# Patient Record
Sex: Female | Born: 1935 | Race: White | Hispanic: No | Marital: Married | Smoking: Never smoker
Health system: Southern US, Community
[De-identification: ages and names within clinical notes are randomized; demographics above are authoritative.]

## PROBLEM LIST (undated history)

## (undated) DIAGNOSIS — I10 Essential (primary) hypertension: Secondary | ICD-10-CM

## (undated) DIAGNOSIS — E079 Disorder of thyroid, unspecified: Secondary | ICD-10-CM

## (undated) DIAGNOSIS — I4891 Unspecified atrial fibrillation: Secondary | ICD-10-CM

## (undated) DIAGNOSIS — M1990 Unspecified osteoarthritis, unspecified site: Secondary | ICD-10-CM

## (undated) HISTORY — PX: JOINT REPLACEMENT: SHX530

---

## 1999-11-04 ENCOUNTER — Encounter: Admission: RE | Admit: 1999-11-04 | Discharge: 1999-11-04 | Payer: Self-pay | Admitting: Internal Medicine

## 1999-11-04 ENCOUNTER — Encounter: Payer: Self-pay | Admitting: Internal Medicine

## 2001-05-01 ENCOUNTER — Emergency Department (HOSPITAL_COMMUNITY): Admission: EM | Admit: 2001-05-01 | Discharge: 2001-05-01 | Payer: Self-pay

## 2002-06-06 ENCOUNTER — Encounter: Payer: Self-pay | Admitting: Internal Medicine

## 2002-06-06 ENCOUNTER — Encounter: Admission: RE | Admit: 2002-06-06 | Discharge: 2002-06-06 | Payer: Self-pay | Admitting: Internal Medicine

## 2002-06-07 ENCOUNTER — Encounter: Payer: Self-pay | Admitting: Internal Medicine

## 2002-06-07 ENCOUNTER — Encounter: Admission: RE | Admit: 2002-06-07 | Discharge: 2002-06-07 | Payer: Self-pay | Admitting: Internal Medicine

## 2006-10-18 ENCOUNTER — Encounter: Admission: RE | Admit: 2006-10-18 | Discharge: 2006-10-18 | Payer: Self-pay | Admitting: Sports Medicine

## 2006-11-30 ENCOUNTER — Ambulatory Visit (HOSPITAL_COMMUNITY): Admission: RE | Admit: 2006-11-30 | Discharge: 2006-11-30 | Payer: Self-pay | Admitting: Orthopedic Surgery

## 2006-12-15 ENCOUNTER — Encounter: Admission: RE | Admit: 2006-12-15 | Discharge: 2006-12-15 | Payer: Self-pay | Admitting: Orthopedic Surgery

## 2007-11-04 ENCOUNTER — Encounter: Admission: RE | Admit: 2007-11-04 | Discharge: 2007-11-04 | Payer: Self-pay | Admitting: Orthopedic Surgery

## 2008-02-01 ENCOUNTER — Inpatient Hospital Stay (HOSPITAL_COMMUNITY): Admission: RE | Admit: 2008-02-01 | Discharge: 2008-02-06 | Payer: Self-pay | Admitting: Orthopedic Surgery

## 2010-10-03 ENCOUNTER — Inpatient Hospital Stay (INDEPENDENT_AMBULATORY_CARE_PROVIDER_SITE_OTHER)
Admission: RE | Admit: 2010-10-03 | Discharge: 2010-10-03 | Disposition: A | Discharge status: Home / Self Care | Payer: PRIVATE HEALTH INSURANCE | Source: Ambulatory Visit | Attending: Family Medicine | Admitting: Family Medicine

## 2010-10-03 ENCOUNTER — Ambulatory Visit (INDEPENDENT_AMBULATORY_CARE_PROVIDER_SITE_OTHER): Payer: PRIVATE HEALTH INSURANCE

## 2010-10-03 DIAGNOSIS — J40 Bronchitis, not specified as acute or chronic: Secondary | ICD-10-CM

## 2010-12-30 NOTE — Discharge Summary (Signed)
NAMEGRENDA, [name]   MEDICAL RECORD NO.:  [id number]          PATIENT TYPE:  INP   LOCATION:  5005                         FACILITY:  [facility]   PHYSICIAN:  Loreta Ave, M.D. DATE OF BIRTH:  October 28, 1920   DATE OF ADMISSION:  02/01/2008  DATE OF DISCHARGE:                               DISCHARGE SUMMARY   FINAL DIAGNOSES:  1. Status post right total hip replacement for end-stage degenerative      joint disease.  2. Atrial fibrillation.  3. Hypertension.   HISTORY OF PRESENT ILLNESS:  An 77 year old white female with history of  end-stage DJD right hip presented to our office with complaints of  chronic pain.  Symptoms progressively worsened with failed conservative  treatment.   HOSPITAL COURSE:  On February 01, 2008, the patient was taken to the [facility] OR and a right total hip replacement procedure performed.  Surgeon  Loreta Ave, M.D. and assistant Genene Churn. Barry Dienes, PA-C.   ANESTHESIA:  General.   ESTIMATED BLOOD LOSS:  250 mL.   There were no surgical or anesthesia complications and the patient was  transferred to recovery in stable condition.  February 02, 2008, the patient  doing well and pain controlled.  Dressing clean, dry, and intact.  Calf  nontender.  Neurovascularly intact.   PHARMACY PROTOCOL:  Coumadin and heparin protocol started as well for  DVT prophylaxis.   CARE MANAGEMENT:  To start arranging transfer to [place] skilled  facility.  DC'd PCA.   On February 03, 2008, the patient states that she is doing great.  She says  that she is ready for transfer to [facility] when a bed is available.  Pain control.  She has done some hall ambulation.   Temperature 97, pulse 80, respirations 18, blood pressure 105/59.  WBC  12.1, hemoglobin 10.0, hematocrit 29.6, platelets 174.  Sodium 134,  potassium 3.3, chloride 94, CO2 31, BUN 14, creatinine 0.82, glucose  124, INR 1.1.   The wound looks good, staples intact.  No  drainage or signs of  infection.  Calf nontender.  She is neurovascularly intact distally.   CONDITION:  Good and stable.   DISPOSITION:  Transfer to skilled nursing facility.   DISCHARGE MEDICATIONS:  1. Lanoxin 125 mcg 1 tablet on odd days and 2 tablets on even days.  2. Triamterine 75/50 one tab p.o. q. a.m.  3. Coumadin pharmacy protocol x4 weeks for postoperative DVT      prophylaxis.  Maintain INR 2 to 3.  4. Lovenox 40 mg 1 subcutaneous injection daily and discontinue when      Coumadin is therapeutic with INR of 2 to 3.  5. Percocet 5/325 one to two tabs p.o. q.4 to 6 hours p.r.n. pain.  6. Robaxin 500 mg 1 tab p.o. q.6 hours p.r.n. spasm.   INSTRUCTIONS:  While at the skilled facility, the patient will continue  to work with PT/OT to improve hip range of motion, strengthening, and  ambulation.  She is weightbearing as tolerated with a walker.  Daily  dressing changes with 4 x  4 gauze and tape.  She is okay to shower, but  no tub soaking.  PT to instruct hip precautions.  She will follow up in  the office 2 weeks postop for recheck with Dr. Eulah Pont.  Notify us  immediately if there are any questions or concerns before that time.  Hip staples will be removed at 2 weeks postop in our office.      Genene Churn. Denton Meek.      Loreta Ave, M.D.  Electronically Signed    JMO/MEDQ  D:  02/03/2008  T:  02/03/2008  Job:  [id number]

## 2010-12-30 NOTE — Discharge Summary (Signed)
NAME:  [name], BERNATH NO.:  [id number]   MEDICAL RECORD NO.:  [id number]          PATIENT TYPE:  INP   LOCATION:  5005                         FACILITY:  [facility]   PHYSICIAN:  Loreta Ave, M.D. DATE OF BIRTH:  1921-05-06   DATE OF ADMISSION:  02/01/2008  DATE OF DISCHARGE:                               DISCHARGE SUMMARY   ADDENDUM   HOSPITAL COURSE:  On February 04, 2008, the patient doing well without  complaints.  Vital signs stable, afebrile.  INR 1.1 and hemoglobin 10.  Wound looks good and staples intact.  No drainage or signs of infection.  Awaiting Skilled Nursing Facility placement.  Transfer paper not all  completed from care management.  On February 05, 2008, the patient doing  well.  Again no complaints.  Vital signs stable, afebrile.  Wound looks  good.  Hemoglobin 99.  INR 1.3.  On February 06, 2008, the patient states  that her right hip is feeling great.  Pain controlled.  She has not had  a bowel movement yet.  Positive flatus.  Denies chest pain, shortness of  breath, abdominal pain, nausea, or vomiting.  States that she is ready  for transfer to [facility] after she has a bowel movement today.  Wound  looks good and staples intact.  No drainage or signs of infection.  Temperature 97.2, pulse 80, respirations 18, and blood pressure 120/62.   INSTRUCTIONS:  The patient will need to follow up in the office with Dr.  Eulah Pont when she is 2 weeks postop.  Daily dressing changes with 4 x 4  gauze and tape.  Please notify our office immediately if there are any  questions or concerns.      Loreta Ave, M.D.  Electronically Signed     DFM/MEDQ  D:  02/06/2008  T:  02/06/2008  Job:  [id number]

## 2010-12-30 NOTE — Op Note (Signed)
[name], [name] NO.:  [id number]   MEDICAL RECORD NO.:  [id number]          PATIENT TYPE:  INP   LOCATION:  5005                         FACILITY:  [facility]   PHYSICIAN:  Loreta Ave, M.D. DATE OF BIRTH:  [birth year]   DATE OF PROCEDURE:  02/01/2008  DATE OF DISCHARGE:                               OPERATIVE REPORT   PREOPERATIVE DIAGNOSIS:  End-stage degenerative arthritis right hip with  marked bony erosion throughout.  Underlying osteoporosis.   POSTOPERATIVE DIAGNOSIS:  End-stage degenerative arthritis right hip  with marked bony erosion throughout.  Underlying osteoporosis.   PROCEDURE:  Right total hip replacement utilizing Stryker prosthesis.  PressFit 54-mm acetabular shell screw fixation x2.  40-mm internal  diameter polyethylene liner.  Pressurized cement technique for a  cemented EON plus, #6 femoral component restoring normal anteversion.  A  40-mm plus 2.5-mm metallic head.   SURGEON:  Loreta Ave, M.D.   ASSISTANT:  Zonia Kief, PA, present throughout the entire case  necessary for timely completion of procedure.   ANESTHESIA:  General   BLOOD LOSS:  300 mL.   BLOOD GIVEN:  None.   SPECIMENS:  None.   COUNTS:  None.   COMPLICATIONS:  None.   DRESSING:  Soft compressive with abduction pillow.   PROCEDURE:  The patient was brought to the operating room, placed on the  operating table in supine position.  After adequate anesthesia had been  obtained, turned to a lateral position right side up.  Prepped and  draped in usual sterile fashion.  An incision along the shaft of the  femur to the trochanter and then extending posterosuperior.  Skin and  subcutaneous tissue divided.  Iliotibial band exposed and incised.  Marked atrophic tissue from numerous injections in a trochanter.  The  bursa resected.  External rotator capsule taken down off the back of the  intertrochanteric groove on the femur.  These were also very thinned  and  a little atrophic from injections.  They were tagged with FiberWire.  The hip dislocated.  Very soft bone with extensive osteolysis on both  sides of the joint.  Femoral neck cut one fingerbreadth above the lesser  trochanter.  Acetabulum exposed.  This was cleared out and then  sequentially reamed up to good bleeding bone with good medial and  inferior placement.  Fitted with a 54-mm component which was placed in  45 degrees of abduction, 20 degrees of anteversion.  Good capturing and  fixation, which was augmented with 2 screws through the cup.  A 40-mm  internal diameter polyethylene liner applied and hammered in place.  Attention turned to the femur.  Power and then handheld reamers and  broaches up to good filling.  Size for #6 cemented component.  After  appropriate trials, I chose the 40 mm +2.5 mm head.  This gave equal  lengths, good stability in flexion and extension.  All trials removed.  Copious irrigation.  Cement restricter placed distal to the femoral  component.  Pulse lavage of the shaft.  Cement prepared and then under  pressurized cement technique, the  femoral component was cemented down  into place into the femur restoring normal anteversion.  Once the cement  hardened, the head was attached with a 40-mm head +2.5 mm.  Hip reduced.  Again I was very pleased with alignment position, leg lengths, and  stability.  Wound irrigated.  External rotator capsule repaired at the  back of the intertrochanteric grooves with drill holes out of a bony  bridge.  Charnley retractor removed.  Iliotibial band closed with #1  Vicryls.  Skin and subcutaneous tissue with Vicryl and staples.  Margins  were injected Marcaine.  Sterile compressive dressing applied.  Returned  in supine position.  Anesthesia reversed.  Brought to recovery room.  He  tolerated surgery well.  No complications.      Loreta Ave, M.D.  Electronically Signed     DFM/MEDQ  D:  02/01/2008  T:   02/02/2008  Job:  [id number]

## 2011-05-14 LAB — URINALYSIS, ROUTINE W REFLEX MICROSCOPIC
Bilirubin Urine: NEGATIVE
Hgb urine dipstick: NEGATIVE
Ketones, ur: NEGATIVE
Nitrite: NEGATIVE
Nitrite: NEGATIVE
Protein, ur: NEGATIVE
Specific Gravity, Urine: 1.016
Urobilinogen, UA: 0.2
Urobilinogen, UA: 0.2
pH: 7.5

## 2011-05-14 LAB — BASIC METABOLIC PANEL
BUN: 14
CO2: 29
CO2: 31
CO2: 32
Chloride: 100
Chloride: 101
Chloride: 97
Chloride: 98
Creatinine, Ser: 0.82
Creatinine, Ser: 0.83
Creatinine, Ser: 0.84
GFR calc Af Amer: >60
GFR calc Af Amer: >60
GFR calc Af Amer: >60
GFR calc non Af Amer: >60
Glucose, Bld: 106 — ABNORMAL HIGH
Glucose, Bld: 117 — ABNORMAL HIGH
Glucose, Bld: 124 — ABNORMAL HIGH
Potassium: 3.3 — ABNORMAL LOW
Potassium: 3.8
Potassium: 4.4
Sodium: 134 — ABNORMAL LOW

## 2011-05-14 LAB — CBC
HCT: 44.5
HCT: 29.3 — ABNORMAL LOW
HCT: 29.4 — ABNORMAL LOW
HCT: 29.6 — ABNORMAL LOW
HCT: 30.4 — ABNORMAL LOW
Hemoglobin: 10 — ABNORMAL LOW
Hemoglobin: 10.7 — ABNORMAL LOW
Hemoglobin: 9.9 — ABNORMAL LOW
MCHC: 34
MCHC: 33.8
MCHC: 34.1
MCHC: 34.1
MCV: 93.5
MCV: 93.6
MCV: 93.7
MCV: 93.8
MCV: 94.3
Platelets: 217
RBC: 4.76
RBC: 3.08 — ABNORMAL LOW
RBC: 3.13 — ABNORMAL LOW
RBC: 3.24 — ABNORMAL LOW
RDW: 12.7
RDW: 12.9
RDW: 12.9
WBC: 14.3 — ABNORMAL HIGH
WBC: 13.8 — ABNORMAL HIGH

## 2011-05-14 LAB — COMPREHENSIVE METABOLIC PANEL
BUN: 20
CO2: 24
Calcium: 9.6
Chloride: 100
Creatinine, Ser: 1.03
GFR calc non Af Amer: 51 — ABNORMAL LOW
Total Bilirubin: 0.9

## 2011-05-14 LAB — URINE MICROSCOPIC-ADD ON

## 2011-05-14 LAB — TYPE AND SCREEN
ABO/RH(D): O POS
Antibody Screen: POSITIVE
Antibody Screen: POSITIVE
Donor AG Type: NEGATIVE
PT AG Type: NEGATIVE

## 2011-05-14 LAB — APTT: aPTT: 33

## 2011-05-14 LAB — PROTIME-INR
INR: 1
Prothrombin Time: 13.4
Prothrombin Time: 14
Prothrombin Time: 16.9 — ABNORMAL HIGH

## 2011-05-14 LAB — URINE CULTURE

## 2011-10-26 ENCOUNTER — Emergency Department (INDEPENDENT_AMBULATORY_CARE_PROVIDER_SITE_OTHER)
Admission: EM | Admit: 2011-10-26 | Discharge: 2011-10-26 | Disposition: A | Discharge status: Home / Self Care | Payer: Medicare Other | Source: Home / Self Care | Attending: Family Medicine | Admitting: Family Medicine

## 2011-10-26 ENCOUNTER — Encounter (HOSPITAL_COMMUNITY): Payer: Self-pay | Admitting: *Deleted

## 2011-10-26 DIAGNOSIS — K029 Dental caries, unspecified: Secondary | ICD-10-CM

## 2011-10-26 DIAGNOSIS — S025XXA Fracture of tooth (traumatic), initial encounter for closed fracture: Secondary | ICD-10-CM

## 2011-10-26 DIAGNOSIS — S032XXA Dislocation of tooth, initial encounter: Secondary | ICD-10-CM

## 2011-10-26 HISTORY — DX: Disorder of thyroid, unspecified: E07.9

## 2011-10-26 HISTORY — DX: Essential (primary) hypertension: I10

## 2011-10-26 HISTORY — DX: Unspecified atrial fibrillation: I48.91

## 2011-10-26 MED ORDER — OXYCODONE-ACETAMINOPHEN 5-325 MG PO TABS: ORAL_TABLET | ORAL | Status: AC

## 2011-10-26 MED ORDER — CLINDAMYCIN HCL 150 MG PO CAPS: 150.0000 mg | ORAL_CAPSULE | Freq: Four times a day (QID) | ORAL | Status: AC

## 2011-10-26 NOTE — ED Provider Notes (Signed)
History     CSN: [id number]  Arrival date & time 10/26/11  1643   First MD Initiated Contact with Patient 10/26/11 1715      Chief Complaint  Patient presents with  . Dental Pain    (Consider location/radiation/quality/duration/timing/severity/associated sxs/prior treatment) HPI Comments: [name] presents for evaluation of right upper tooth pain. She reports that she a crown in place. That broke off. Wednesday evening during dinner. She denies any discomfort in the tooth until Friday. She spoke with her dentist, who will see her next week in the office. She reports that the pain is unbearable has not responded to Orajel or tramadol.  Patient is a 78 y.o. female presenting with tooth pain. The history is provided by the patient.  Dental PainThe primary symptoms include mouth pain and dental injury. The symptoms began 5 to 7 days ago. The symptoms are worsening. The symptoms are new.  Mouth pain began 5 - 7 days ago. Mouth pain occurs constantly. Mouth pain is worsening. Affected locations include: teeth and gum(s). At its highest the mouth pain was at 10/10.  The dental injury occurred 5 - 7 days ago. Affected teeth include: 7/right upper lateral incisor. The injury is a crown fracture. The injury was caused by an unknown mechanism.  Additional symptoms include: dental sensitivity to temperature and gum tenderness. Additional symptoms do not include: purulent gums.    Past Medical History  Diagnosis Date  . Atrial fibrillation   . Hypertension   . Thyroid disease     Hypothyroidism    Past Surgical History  Procedure Date  . Joint replacement     Hip replacement    No family history on file.  History  Substance Use Topics  . Smoking status: Not on file  . Smokeless tobacco: Not on file  . Alcohol Use: No    OB History    Grav Para Term Preterm Abortions TAB SAB Ect Mult Living                  Review of Systems  Constitutional: Negative.   HENT: Positive for  dental problem.   Eyes: Negative.   Respiratory: Negative.   Cardiovascular: Negative.   Gastrointestinal: Negative.   Genitourinary: Negative.   Musculoskeletal: Negative.   Skin: Negative.   Neurological: Negative.     Allergies  Penicillins  Home Medications   Current Outpatient Rx  Name Route Sig Dispense Refill  . ASPIRIN 81 MG PO TABS Oral Take 81 mg by mouth daily.    Marland Kitchen DIGOXIN PO Oral Take by mouth.    Marland Kitchen LEVOTHYROXINE SODIUM PO Oral Take by mouth.    . TRAMADOL HCL PO Oral Take by mouth.    . TRIAMTERENE-HCTZ 50-25 MG PO CAPS Oral Take 1 capsule by mouth every morning.    Marland Kitchen CLINDAMYCIN HCL 150 MG PO CAPS Oral Take 1 capsule (150 mg total) by mouth every 6 (six) hours. 28 capsule 0  . OXYCODONE-ACETAMINOPHEN 5-325 MG PO TABS  Take one to two tablets every 4 to 6 hours as needed for pain 20 tablet 0    BP 171/83  Pulse 100  Temp(Src) 98.6 F (37 C) (Oral)  Resp 16  SpO2 96%  Physical Exam  Nursing note and vitals reviewed. Constitutional: She is oriented to person, place, and time. She appears well-developed and well-nourished.  HENT:  Head: Normocephalic and atraumatic.  Right Ear: Tympanic membrane normal.  Left Ear: Tympanic membrane normal.  Mouth/Throat:    Eyes:  EOM are normal.  Neck: Normal range of motion.  Pulmonary/Chest: Effort normal.  Musculoskeletal: Normal range of motion.  Neurological: She is alert and oriented to person, place, and time.  Skin: Skin is warm and dry.  Psychiatric: Her behavior is normal.    ED Course  Procedures (including critical care time)  Labs Reviewed - No data to display No results found.   1. Tooth avulsion   2. Dental caries       MDM  rx given for clindamycin 150 mg 4x daily x 7 days, with PRN oxycodone/acetaminophen; will follow up with dental provider next week as scheduled        Renaee Munda, MD 10/26/11 2055

## 2011-10-26 NOTE — ED Notes (Signed)
Reports right upper tooth breaking off Wed; Friday started w/ some discomfort; spoke with dentist today - dentist informed they would have to contact PCP office to [place] for treatment based on hx;  At 1200 today pain became unbearable.  Has taken her normal tramadol without relief; has also tried Orajel.

## 2011-10-26 NOTE — Discharge Instructions (Signed)
Take the antibiotics and pain medication as directed. You may also continue to use topical anesthetics such as Orajel or similar preparation for pain relief. Please follow up with your dental provider next week as scheduled and as discussed. Return to care should your symptoms not improve, or worsen in any way.

## 2012-08-29 ENCOUNTER — Other Ambulatory Visit: Payer: Self-pay | Admitting: Internal Medicine

## 2012-08-29 ENCOUNTER — Ambulatory Visit
Admission: RE | Admit: 2012-08-29 | Discharge: 2012-08-29 | Disposition: A | Discharge status: Home / Self Care | Payer: Self-pay | Source: Ambulatory Visit | Attending: Internal Medicine | Admitting: Internal Medicine

## 2012-08-29 DIAGNOSIS — R52 Pain, unspecified: Secondary | ICD-10-CM

## 2013-12-27 ENCOUNTER — Ambulatory Visit
Admission: RE | Admit: 2013-12-27 | Discharge: 2013-12-27 | Disposition: A | Discharge status: Home / Self Care | Payer: Commercial Managed Care - HMO | Source: Ambulatory Visit | Attending: Internal Medicine | Admitting: Internal Medicine

## 2013-12-27 ENCOUNTER — Other Ambulatory Visit: Payer: Self-pay | Admitting: Internal Medicine

## 2013-12-27 DIAGNOSIS — M545 Low back pain, unspecified: Secondary | ICD-10-CM

## 2014-03-14 ENCOUNTER — Ambulatory Visit
Admission: RE | Admit: 2014-03-14 | Discharge: 2014-03-14 | Disposition: A | Discharge status: Home / Self Care | Payer: Commercial Managed Care - HMO | Source: Ambulatory Visit | Attending: Internal Medicine | Admitting: Internal Medicine

## 2014-03-14 ENCOUNTER — Other Ambulatory Visit: Payer: Self-pay | Admitting: Internal Medicine

## 2014-03-14 DIAGNOSIS — M25562 Pain in left knee: Secondary | ICD-10-CM

## 2014-09-23 ENCOUNTER — Encounter (HOSPITAL_COMMUNITY): Payer: Self-pay

## 2014-09-23 ENCOUNTER — Inpatient Hospital Stay (HOSPITAL_COMMUNITY): Payer: Medicare HMO

## 2014-09-23 ENCOUNTER — Emergency Department (HOSPITAL_COMMUNITY): Payer: Medicare HMO

## 2014-09-23 ENCOUNTER — Inpatient Hospital Stay (HOSPITAL_COMMUNITY)
Admission: EM | Admit: 2014-09-23 | Discharge: 2014-09-26 | DRG: 065 | Disposition: A | Discharge status: Skilled Nursing Facility | Payer: Medicare HMO | Attending: Internal Medicine | Admitting: Internal Medicine

## 2014-09-23 DIAGNOSIS — W19XXXA Unspecified fall, initial encounter: Secondary | ICD-10-CM

## 2014-09-23 DIAGNOSIS — Z79899 Other long term (current) drug therapy: Secondary | ICD-10-CM | POA: Diagnosis not present

## 2014-09-23 DIAGNOSIS — E876 Hypokalemia: Secondary | ICD-10-CM | POA: Diagnosis present

## 2014-09-23 DIAGNOSIS — M1990 Unspecified osteoarthritis, unspecified site: Secondary | ICD-10-CM | POA: Diagnosis present

## 2014-09-23 DIAGNOSIS — Z96649 Presence of unspecified artificial hip joint: Secondary | ICD-10-CM | POA: Diagnosis present

## 2014-09-23 DIAGNOSIS — Z880 Allergy status to penicillin: Secondary | ICD-10-CM | POA: Diagnosis not present

## 2014-09-23 DIAGNOSIS — I272 Other secondary pulmonary hypertension: Secondary | ICD-10-CM | POA: Diagnosis present

## 2014-09-23 DIAGNOSIS — G8194 Hemiplegia, unspecified affecting left nondominant side: Secondary | ICD-10-CM | POA: Diagnosis present

## 2014-09-23 DIAGNOSIS — I63411 Cerebral infarction due to embolism of right middle cerebral artery: Secondary | ICD-10-CM | POA: Diagnosis present

## 2014-09-23 DIAGNOSIS — Z66 Do not resuscitate: Secondary | ICD-10-CM | POA: Diagnosis present

## 2014-09-23 DIAGNOSIS — Z7982 Long term (current) use of aspirin: Secondary | ICD-10-CM | POA: Diagnosis not present

## 2014-09-23 DIAGNOSIS — R531 Weakness: Secondary | ICD-10-CM | POA: Diagnosis not present

## 2014-09-23 DIAGNOSIS — Z79891 Long term (current) use of opiate analgesic: Secondary | ICD-10-CM

## 2014-09-23 DIAGNOSIS — I10 Essential (primary) hypertension: Secondary | ICD-10-CM | POA: Diagnosis present

## 2014-09-23 DIAGNOSIS — I639 Cerebral infarction, unspecified: Secondary | ICD-10-CM | POA: Diagnosis present

## 2014-09-23 DIAGNOSIS — R471 Dysarthria and anarthria: Secondary | ICD-10-CM | POA: Diagnosis present

## 2014-09-23 DIAGNOSIS — I071 Rheumatic tricuspid insufficiency: Secondary | ICD-10-CM | POA: Diagnosis present

## 2014-09-23 DIAGNOSIS — I4891 Unspecified atrial fibrillation: Secondary | ICD-10-CM | POA: Diagnosis present

## 2014-09-23 DIAGNOSIS — R1314 Dysphagia, pharyngoesophageal phase: Secondary | ICD-10-CM | POA: Diagnosis present

## 2014-09-23 DIAGNOSIS — N179 Acute kidney failure, unspecified: Secondary | ICD-10-CM | POA: Diagnosis present

## 2014-09-23 DIAGNOSIS — E039 Hypothyroidism, unspecified: Secondary | ICD-10-CM | POA: Diagnosis present

## 2014-09-23 DIAGNOSIS — E785 Hyperlipidemia, unspecified: Secondary | ICD-10-CM | POA: Diagnosis present

## 2014-09-23 DIAGNOSIS — I481 Persistent atrial fibrillation: Secondary | ICD-10-CM | POA: Diagnosis present

## 2014-09-23 HISTORY — DX: Unspecified osteoarthritis, unspecified site: M19.90

## 2014-09-23 LAB — CBC
HEMATOCRIT: 38.8 % (ref 36.0–46.0)
Hemoglobin: 12.5 g/dL (ref 12.0–15.0)
MCH: 30 pg (ref 26.0–34.0)
MCHC: 32.2 g/dL (ref 30.0–36.0)
MCV: 93.3 fL (ref 78.0–100.0)
Platelets: 160 10*3/uL (ref 150–400)
RBC: 4.16 MIL/uL (ref 3.87–5.11)
RDW: 14.3 % (ref 11.5–15.5)
WBC: 11.3 10*3/uL — ABNORMAL HIGH (ref 4.0–10.5)

## 2014-09-23 LAB — PROTIME-INR
INR: 1.07 (ref 0.00–1.49)
PROTHROMBIN TIME: 14 s (ref 11.6–15.2)

## 2014-09-23 LAB — COMPREHENSIVE METABOLIC PANEL
ALBUMIN: 3.9 g/dL (ref 3.5–5.2)
ALT: 15 U/L (ref 0–35)
AST: 24 U/L (ref 0–37)
Alkaline Phosphatase: 69 U/L (ref 39–117)
Anion gap: 12 (ref 5–15)
BILIRUBIN TOTAL: 0.8 mg/dL (ref 0.3–1.2)
BUN: 24 mg/dL — AB (ref 6–23)
CALCIUM: 9.1 mg/dL (ref 8.4–10.5)
CO2: 22 mmol/L (ref 19–32)
Chloride: 105 mmol/L (ref 96–112)
Creatinine, Ser: 1.57 mg/dL — ABNORMAL HIGH (ref 0.50–1.10)
GFR, EST AFRICAN AMERICAN: 32 mL/min — AB (ref >90)
GFR, EST NON AFRICAN AMERICAN: 28 mL/min — AB (ref >90)
Glucose, Bld: 114 mg/dL — ABNORMAL HIGH (ref 70–99)
Potassium: 3.6 mmol/L (ref 3.5–5.1)
Sodium: 139 mmol/L (ref 135–145)
Total Protein: 6.8 g/dL (ref 6.0–8.3)

## 2014-09-23 LAB — CBG MONITORING, ED: Glucose-Capillary: 108 mg/dL — ABNORMAL HIGH (ref 70–99)

## 2014-09-23 LAB — GLUCOSE, CAPILLARY
GLUCOSE-CAPILLARY: 109 mg/dL — AB (ref 70–99)
Glucose-Capillary: 97 mg/dL (ref 70–99)

## 2014-09-23 LAB — DIFFERENTIAL
BASOS PCT: 1 % (ref 0–1)
Basophils Absolute: 0.1 10*3/uL (ref 0.0–0.1)
EOS PCT: 3 % (ref 0–5)
Eosinophils Absolute: 0.3 10*3/uL (ref 0.0–0.7)
Lymphocytes Relative: 23 % (ref 12–46)
Lymphs Abs: 2.6 10*3/uL (ref 0.7–4.0)
Monocytes Absolute: 0.9 10*3/uL (ref 0.1–1.0)
Monocytes Relative: 8 % (ref 3–12)
Neutro Abs: 7.4 10*3/uL (ref 1.7–7.7)
Neutrophils Relative %: 65 % (ref 43–77)

## 2014-09-23 LAB — URINALYSIS, ROUTINE W REFLEX MICROSCOPIC
Bilirubin Urine: NEGATIVE
Glucose, UA: NEGATIVE mg/dL
Hgb urine dipstick: NEGATIVE
Ketones, ur: NEGATIVE mg/dL
LEUKOCYTES UA: NEGATIVE
NITRITE: NEGATIVE
Protein, ur: NEGATIVE mg/dL
SPECIFIC GRAVITY, URINE: 1.01 (ref 1.005–1.030)
Urobilinogen, UA: 0.2 mg/dL (ref 0.0–1.0)
pH: 5.5 (ref 5.0–8.0)

## 2014-09-23 LAB — I-STAT CHEM 8, ED
BUN: 28 mg/dL — ABNORMAL HIGH (ref 6–23)
CALCIUM ION: 1.04 mmol/L — AB (ref 1.13–1.30)
CREATININE: 1.5 mg/dL — AB (ref 0.50–1.10)
Chloride: 105 mmol/L (ref 96–112)
Glucose, Bld: 113 mg/dL — ABNORMAL HIGH (ref 70–99)
HEMATOCRIT: 40 % (ref 36.0–46.0)
Hemoglobin: 13.6 g/dL (ref 12.0–15.0)
POTASSIUM: 3.6 mmol/L (ref 3.5–5.1)
Sodium: 140 mmol/L (ref 135–145)
TCO2: 19 mmol/L (ref 0–100)

## 2014-09-23 LAB — I-STAT TROPONIN, ED: Troponin i, poc: 0.01 ng/mL (ref 0.00–0.08)

## 2014-09-23 LAB — ETHANOL: Alcohol, Ethyl (B): <5 mg/dL (ref 0–9)

## 2014-09-23 LAB — RAPID URINE DRUG SCREEN, HOSP PERFORMED
Amphetamines: NOT DETECTED
BARBITURATES: NOT DETECTED
Benzodiazepines: NOT DETECTED
Cocaine: NOT DETECTED
OPIATES: NOT DETECTED
TETRAHYDROCANNABINOL: NOT DETECTED

## 2014-09-23 LAB — APTT: aPTT: 32 seconds (ref 24–37)

## 2014-09-23 LAB — DIGOXIN LEVEL: Digoxin Level: 1.2 ng/mL (ref 0.8–2.0)

## 2014-09-23 MED ORDER — ASPIRIN 325 MG PO TABS
325.0000 mg | ORAL_TABLET | Freq: Every day | ORAL | Status: DC
  Administered 2014-09-25: 325 mg via ORAL
  Filled 2014-09-23: qty 1

## 2014-09-23 MED ORDER — ONDANSETRON HCL 4 MG/2ML IJ SOLN: 4.0000 mg | Freq: Four times a day (QID) | INTRAMUSCULAR | Status: DC | PRN

## 2014-09-23 MED ORDER — HYDRALAZINE HCL 20 MG/ML IJ SOLN
10.0000 mg | Freq: Four times a day (QID) | INTRAMUSCULAR | Status: DC | PRN
  Administered 2014-09-23 – 2014-09-24 (×3): 10 mg via INTRAVENOUS
  Filled 2014-09-23 (×4): qty 1

## 2014-09-23 MED ORDER — SENNOSIDES-DOCUSATE SODIUM 8.6-50 MG PO TABS: 1.0000 | ORAL_TABLET | Freq: Every evening | ORAL | Status: DC | PRN

## 2014-09-23 MED ORDER — ASPIRIN 300 MG RE SUPP
300.0000 mg | Freq: Once | RECTAL | Status: AC
  Administered 2014-09-23: 300 mg via RECTAL
  Filled 2014-09-23: qty 1

## 2014-09-23 MED ORDER — DIGOXIN 125 MCG PO TABS
0.1250 mg | ORAL_TABLET | Freq: Every day | ORAL | Status: DC
  Administered 2014-09-24 – 2014-09-26 (×3): 0.125 mg via ORAL
  Filled 2014-09-23 (×3): qty 1

## 2014-09-23 MED ORDER — ASPIRIN 300 MG RE SUPP
300.0000 mg | Freq: Every day | RECTAL | Status: DC
  Administered 2014-09-23 – 2014-09-24 (×2): 300 mg via RECTAL
  Filled 2014-09-23 (×2): qty 1

## 2014-09-23 MED ORDER — SODIUM CHLORIDE 0.9 % IV SOLN: INTRAVENOUS | Status: DC

## 2014-09-23 MED ORDER — AMLODIPINE BESYLATE 2.5 MG PO TABS
2.5000 mg | ORAL_TABLET | Freq: Every day | ORAL | Status: DC
  Administered 2014-09-24 – 2014-09-25 (×2): 2.5 mg via ORAL
  Filled 2014-09-23 (×2): qty 1

## 2014-09-23 MED ORDER — SODIUM CHLORIDE 0.9 % IV SOLN
INTRAVENOUS | Status: DC
  Administered 2014-09-23: 50 mL/h via INTRAVENOUS
  Administered 2014-09-24 – 2014-09-25 (×2): via INTRAVENOUS

## 2014-09-23 MED ORDER — STROKE: EARLY STAGES OF RECOVERY BOOK
Freq: Once | Status: AC
  Administered 2014-09-23: 17:00:00

## 2014-09-23 MED ORDER — HEPARIN SODIUM (PORCINE) 5000 UNIT/ML IJ SOLN
5000.0000 [IU] | Freq: Three times a day (TID) | INTRAMUSCULAR | Status: DC
Start: 2014-09-23 — End: 2014-09-25
  Administered 2014-09-23 – 2014-09-25 (×7): 5000 [IU] via SUBCUTANEOUS
  Filled 2014-09-23 (×7): qty 1

## 2014-09-23 MED ORDER — LEVOTHYROXINE SODIUM 50 MCG PO TABS
50.0000 ug | ORAL_TABLET | Freq: Every day | ORAL | Status: DC
  Administered 2014-09-25 – 2014-09-26 (×2): 50 ug via ORAL
  Filled 2014-09-23 (×2): qty 1

## 2014-09-23 MED ORDER — ACETAMINOPHEN 650 MG RE SUPP
650.0000 mg | Freq: Four times a day (QID) | RECTAL | Status: AC | PRN
  Administered 2014-09-23 – 2014-09-24 (×2): 650 mg via RECTAL
  Filled 2014-09-23 (×2): qty 1

## 2014-09-23 MED ORDER — ASPIRIN 81 MG PO CHEW: 324.0000 mg | CHEWABLE_TABLET | Freq: Once | ORAL | Status: DC

## 2014-09-23 MED ORDER — ALBUTEROL SULFATE (2.5 MG/3ML) 0.083% IN NEBU: 2.5000 mg | INHALATION_SOLUTION | RESPIRATORY_TRACT | Status: DC | PRN

## 2014-09-23 MED ORDER — ACETAMINOPHEN 325 MG PO TABS: 650.0000 mg | ORAL_TABLET | Freq: Four times a day (QID) | ORAL | Status: DC | PRN

## 2014-09-23 NOTE — Evaluation (Signed)
Clinical/Bedside Swallow Evaluation Patient Details  Name: Catherine BiggerMyrtle V Malachowski MRN: [id number] Date of Birth: 1921/04/27  Today's Date: 09/23/2014 Time: SLP Start Time (ACUTE ONLY): 1224 SLP Stop Time (ACUTE ONLY): 1252 SLP Time Calculation (min) (ACUTE ONLY): 28 min  Past Medical History:  Past Medical History  Diagnosis Date  . Atrial fibrillation   . Hypertension   . Thyroid disease     Hypothyroidism   Past Surgical History:  Past Surgical History  Procedure Laterality Date  . Joint replacement      Hip replacement   HPI:  [name] is a 81 y.o. female with a Past Medical History of atrial fibrillation not on anticoagulation (due to significant epistaxis), hypertension, hypothyroidism who presents today with the above noted complaint. Per patient, she was in her usual state of health, she went to bed last evening without any major issues. Around 4 AM this morning, she sustained a fall while attempting to get out of bed and could not get up. She was subsequently brought to the emergency room, which was found to have left-sided weakness, and left facial droop along with dysarthria. CT scan of the head showed a possible acute infarction involving the posterior right internal capsule.   Assessment / Plan / Recommendation Clinical Impression  Pt demonstrates significant oral dysphagia, overt evidence of aspiration with thin and thickened liquids (delayed, prolonged hard coughing) as well as concern for residuals and pharyngeal weakness. Would not recommend any POs at this time, recommend MBS tomorrow to objectively evalaute swallow and determine safety with PO. Pt and family in agreement.     Aspiration Risk  Severe    Diet Recommendation NPO        Other  Recommendations Recommended Consults: MBS Oral Care Recommendations: Oral care Q4 per protocol   Follow Up Recommendations  Inpatient Rehab    Frequency and Duration min 2x/week  2 weeks   Pertinent Vitals/Pain NA     SLP Swallow Goals     Swallow Study Prior Functional Status       General HPI: [name] is a 81 y.o. female with a Past Medical History of atrial fibrillation not on anticoagulation (due to significant epistaxis), hypertension, hypothyroidism who presents today with the above noted complaint. Per patient, she was in her usual state of health, she went to bed last evening without any major issues. Around 4 AM this morning, she sustained a fall while attempting to get out of bed and could not get up. She was subsequently brought to the emergency room, which was found to have left-sided weakness, and left facial droop along with dysarthria. CT scan of the head showed a possible acute infarction involving the posterior right internal capsule. Type of Study: Bedside swallow evaluation Previous Swallow Assessment: none Diet Prior to this Study: NPO Temperature Spikes Noted: No Respiratory Status: Room air History of Recent Intubation: No Behavior/Cognition: Alert;Cooperative;Pleasant mood Oral Cavity - Dentition: Adequate natural dentition Self-Feeding Abilities: Needs assist Patient Positioning: Upright in bed Baseline Vocal Quality: Clear Volitional Cough: Strong Volitional Swallow: Able to elicit    Oral/Motor/Sensory Function Overall Oral Motor/Sensory Function: Impaired Labial ROM: Reduced left Labial Symmetry: Abnormal symmetry left Labial Strength: Reduced Labial Sensation: Reduced Lingual ROM: Reduced left Lingual Symmetry: Abnormal symmetry left Lingual Strength: Reduced Lingual Sensation: Reduced Facial ROM: Reduced left Facial Symmetry: Left droop Facial Strength: Reduced Facial Sensation: Reduced   Ice Chips Ice chips: Not tested   Thin Liquid Thin Liquid: Impaired Presentation: Cup;Self Fed Oral  Phase Impairments: Reduced labial seal Oral Phase Functional Implications: Left anterior spillage;Left lateral sulci pocketing;Oral residue Pharyngeal  Phase  Impairments: Cough - Immediate;Multiple swallows;Wet Vocal Quality    Nectar Thick Nectar Thick Liquid: Impaired Presentation: Spoon Oral Phase Impairments: Reduced labial seal;Reduced lingual movement/coordination;Impaired anterior to posterior transit Oral phase functional implications: Left anterior spillage;Left lateral sulci pocketing;Prolonged oral transit;Oral residue Pharyngeal Phase Impairments: Multiple swallows;Wet Vocal Quality;Throat Clearing - Immediate   Honey Thick Honey Thick Liquid: Not tested   Puree Puree: Impaired Presentation: Spoon Oral Phase Impairments: Reduced labial seal;Reduced lingual movement/coordination;Impaired anterior to posterior transit Oral Phase Functional Implications: Left anterior spillage;Left lateral sulci pocketing;Prolonged oral transit;Oral residue Pharyngeal Phase Impairments: Suspected delayed Swallow;Multiple swallows;Wet Vocal Quality;Throat Clearing - Delayed   Solid   GO    Solid: Not tested      Harlon Ditty, MA CCC-SLP [phone]  Deagen Krass, Riley Nearing 09/23/2014,1:50 PM

## 2014-09-23 NOTE — Consult Note (Signed)
Admission H&P    Chief Complaint: New onset left facial droop and slurred speech.  HPI: [name] is an 81 y.o. female with history of atrial fibrillation on anticoagulation, hypertension and hypothyroidism, presenting with new onset left facial droop and slurred speech. Patient also had left-sided weakness and fell to tempted to get out of bed. She was last seen well at 10 PM on 09/22/2014. She's been on aspirin daily. CT scan of her head and equivocal acute infarction involving the posterior right internal capsule. NIH stroke score was 3. She was beyond time window for treatment consideration with TPA.  LSN: 10 PM on 09/22/2014 tPA Given: No: Beyond time window for treatment mRankin:  Past Medical History  Diagnosis Date  . Atrial fibrillation   . Hypertension   . Thyroid disease     Hypothyroidism    Past Surgical History  Procedure Laterality Date  . Joint replacement      Hip replacement    History reviewed. No pertinent family history. Social History:  reports that she does not drink alcohol or use illicit drugs. Her tobacco history is not on file.  Allergies:  Allergies  Allergen Reactions  . Penicillins Rash    Medications: Patient's outpatient medication list personally reviewed by me.  ROS: History obtained from patient's daughter and the patient  General ROS: negative for - chills, fatigue, fever, night sweats, weight gain or weight loss Psychological ROS: negative for - behavioral disorder, hallucinations, memory difficulties, mood swings or suicidal ideation Ophthalmic ROS: negative for - blurry vision, double vision, eye pain or loss of vision ENT ROS: negative for - epistaxis, nasal discharge, oral lesions, sore throat, tinnitus or vertigo Allergy and Immunology ROS: negative for - hives or itchy/watery eyes Hematological and Lymphatic ROS: negative for - bleeding problems, bruising or swollen lymph nodes Endocrine ROS: negative for - galactorrhea,  hair pattern changes, polydipsia/polyuria or temperature intolerance Respiratory ROS: negative for - cough, hemoptysis, shortness of breath or wheezing Cardiovascular ROS: negative for - chest pain, dyspnea on exertion, edema or irregular heartbeat Gastrointestinal ROS: negative for - abdominal pain, diarrhea, hematemesis, nausea/vomiting or stool incontinence Genito-Urinary ROS: negative for - dysuria, hematuria, incontinence or urinary frequency/urgency Musculoskeletal ROS: negative for - joint swelling or muscular weakness Neurological ROS: as noted in HPI Dermatological ROS: negative for rash and skin lesion changes  Physical Examination: Blood pressure 192/67, pulse 52, temperature 97.9 F (36.6 C), temperature source Oral, resp. rate 14, height  (1.6 m), weight 74.844 kg (165 lb), SpO2 98 %.  HEENT-  Normocephalic, no lesions, without obvious abnormality.  Normal external eye and conjunctiva.  Normal TM's bilaterally.  Normal auditory canals and external ears. Normal external nose, mucus membranes and septum.  Normal pharynx. Neck supple with no masses, nodes, nodules or enlargement. Cardiovascular - irregularly irregular rhythm and S1, S2 normal Extremities - no joint deformities, effusion, or inflammation, no edema and no skin discoloration  Neurologic Examination: Mental Status: Alert, oriented, thought content appropriate.  Speech moderately slurred without evidence of aphasia. Able to follow commands without difficulty. Cranial Nerves: II-Visual fields were normal. III/IV/VI-Pupils were equal and reacted. Extraocular movements were full and conjugate.    V/VII-no facial numbness and no facial weakness. VIII-mild to moderate hearing loss bilaterally. X-moderate dysarthria; symmetrical palatal movement. XII-midline tongue extension Motor: 5/5 bilaterally with normal tone and bulk Sensory: Normal throughout. Deep Tendon Reflexes: Absent and symmetric. Plantars: Mute  bilaterally Cerebellar: Normal finger-to-nose testing. Carotid auscultation: Normal  Results for orders  placed or performed during the hospital encounter of 09/23/14 (from the past 48 hour(s))  CBC     Status: Abnormal   Collection Time: 09/23/14  5:11 AM  Result Value Ref Range   WBC 11.3 (H) 4.0 - 10.5 K/uL   RBC 4.16 3.87 - 5.11 MIL/uL   Hemoglobin 12.5 12.0 - 15.0 g/dL   HCT 96.0 45.4 - 09.8 %   MCV 93.3 78.0 - 100.0 fL   MCH 30.0 26.0 - 34.0 pg   MCHC 32.2 30.0 - 36.0 g/dL   RDW 11.9 14.7 - 82.9 %   Platelets 160 150 - 400 K/uL  Differential     Status: None   Collection Time: 09/23/14  5:11 AM  Result Value Ref Range   Neutrophils Relative % 65 43 - 77 %   Neutro Abs 7.4 1.7 - 7.7 K/uL   Lymphocytes Relative 23 12 - 46 %   Lymphs Abs 2.6 0.7 - 4.0 K/uL   Monocytes Relative 8 3 - 12 %   Monocytes Absolute 0.9 0.1 - 1.0 K/uL   Eosinophils Relative 3 0 - 5 %   Eosinophils Absolute 0.3 0.0 - 0.7 K/uL   Basophils Relative 1 0 - 1 %   Basophils Absolute 0.1 0.0 - 0.1 K/uL  I-Stat Troponin, ED (not at [facility])     Status: None   Collection Time: 09/23/14  5:16 AM  Result Value Ref Range   Troponin i, poc 0.01 0.00 - 0.08 ng/mL   Comment 3            Comment: Due to the release kinetics of cTnI, a negative result within the first hours of the onset of symptoms does not rule out myocardial infarction with certainty. If myocardial infarction is still suspected, repeat the test at appropriate intervals.   I-Stat Chem 8, ED     Status: Abnormal   Collection Time: 09/23/14  5:19 AM  Result Value Ref Range   Sodium 140 135 - 145 mmol/L   Potassium 3.6 3.5 - 5.1 mmol/L   Chloride 105 96 - 112 mmol/L   BUN 28 (H) 6 - 23 mg/dL   Creatinine, Ser 5.62 (H) 0.50 - 1.10 mg/dL   Glucose, Bld 130 (H) 70 - 99 mg/dL   Calcium, Ion 8.65 (L) 1.13 - 1.30 mmol/L   TCO2 19 0 - 100 mmol/L   Hemoglobin 13.6 12.0 - 15.0 g/dL   HCT 78.4 69.6 - 29.5 %  CBG monitoring, ED     Status: Abnormal    Collection Time: 09/23/14  5:36 AM  Result Value Ref Range   Glucose-Capillary 108 (H) 70 - 99 mg/dL   Ct Head Wo Contrast  09/23/2014   CLINICAL DATA:  Code stroke.  Left facial droop.  EXAM: CT HEAD WITHOUT CONTRAST  TECHNIQUE: Contiguous axial images were obtained from the base of the skull through the vertex without intravenous contrast.  COMPARISON:  None.  FINDINGS: No intracranial hemorrhage. There is a questionable small focus of decreased density in the right posterior limb of the internal capsule that may reflect a small lacunar infarct versus volume averaging. There is no territorial infarct. Mild periventricular white matter change. No hydrocephalus. The basilar cisterns are patent. No intracranial fluid collection. Calvarium is intact. Included paranasal sinuses and mastoid air cells are well aerated.  IMPRESSION: 1. Questionable small lacunar infarct posterior limb right internal capsule. 2. No intracranial hemorrhage. These results were called by telephone at the time of interpretation on 09/23/2014  at 5:33 am to Dr. Karma GanjaLinker, who verbally acknowledged these results.   Electronically Signed   By: Rubye OaksMelanie  Ehinger M.D.   On: 09/23/2014 05:34    Assessment: 81 y.o. female with multiple risk factors for stroke presenting with probable right subcortical small vessel ischemic cerebral infarction.  Stroke Risk Factors - atrial fibrillation and hypertension  Plan: 1. HgbA1c, fasting lipid panel 2. MRI, MRA  of the brain without contrast 3. PT consult, OT consult, Speech consult 4. Echocardiogram 5. Carotid dopplers 6. Prophylactic therapy-Antiplatelet med: Aspirin  7. Risk factor modification 8. Telemetry monitoring   C.R. Roseanne RenoStewart, MD [facility] [phone]  09/23/2014, 5:42 AM

## 2014-09-23 NOTE — ED Notes (Signed)
Report given to 4N charge nurse-- will give Apresoline here prior to transfer-- due to high BP.

## 2014-09-23 NOTE — ED Notes (Signed)
Family at bedside. 

## 2014-09-23 NOTE — ED Notes (Signed)
Patient transported to X-ray 

## 2014-09-23 NOTE — ED Notes (Signed)
Pt here by ems, [place], code stroke, reports she got out of bed to turn on light and slide out of bed, ems called and noted left side weakness and facial droop and slurred speech, bruising noted to left eye and right knee. Pt does have hx of a fib and reports that lsn at 2200

## 2014-09-23 NOTE — ED Provider Notes (Signed)
CSN: [id number]     Arrival date & time 09/23/14  0509 History   First MD Initiated Contact with Patient 09/23/14 [phone]     Chief Complaint  Patient presents with  . Code Stroke    @ (Consider location/radiation/quality/duration/timing/severity/associated sxs/prior Treatment) HPI  A LEVEL 5 CAVEAT PERTAINS DUE TO URGENT NEED FOR INTERVENTION. Pt presents from home with onset of left sided weakness and left facial droop. Pt came in as code stroke notification by EMS.  Last seen normal 10pm.  Left sided extremity weakness has resolved upon arrival.  Pt also had fall when she tried to get out of bed due to weakness.  She c/o right knee and right shoulder pain.    Past Medical History  Diagnosis Date  . Atrial fibrillation   . Hypertension   . Thyroid disease     Hypothyroidism   Past Surgical History  Procedure Laterality Date  . Joint replacement      Hip replacement   History reviewed. No pertinent family history. History  Substance Use Topics  . Smoking status: Not on file  . Smokeless tobacco: Not on file  . Alcohol Use: No   OB History    No data available     Review of Systems  UNABLE TO OBTAIN ROS DUE TO LEVEL 5 CAVEAT    Allergies  Penicillins  Home Medications   Prior to Admission medications   Medication Sig Start Date End Date Taking? Authorizing Provider  aspirin 81 MG tablet Take 81 mg by mouth daily.   Yes Historical Provider, MD  digoxin (LANOXIN) 0.125 MG tablet Take 0.125 mg by mouth daily.   Yes Historical Provider, MD  levothyroxine (SYNTHROID, LEVOTHROID) 50 MCG tablet Take 50 mcg by mouth daily before breakfast.   Yes Historical Provider, MD  losartan (COZAAR) 100 MG tablet Take 100 mg by mouth daily.   Yes Historical Provider, MD  oxyCODONE-acetaminophen (PERCOCET/ROXICET) 5-325 MG per tablet Take 1 tablet by mouth every 8 (eight) hours as needed for moderate pain or severe pain.   Yes Historical Provider, MD   triamterene-hydrochlorothiazide (MAXZIDE) 75-50 MG per tablet Take 1 tablet by mouth daily.   Yes Historical Provider, MD  DIGOXIN PO Take by mouth.    Historical Provider, MD  LEVOTHYROXINE SODIUM PO Take by mouth.    Historical Provider, MD  TRAMADOL HCL PO Take by mouth.    Historical Provider, MD  triamterene-hydrochlorothiazide (DYAZIDE) 50-25 MG per capsule Take 1 capsule by mouth every morning.    Historical Provider, MD  triamterene-hydrochlorothiazide (MAXZIDE-25) 37.5-25 MG per tablet Take 1 tablet by mouth daily.    Historical Provider, MD   BP 195/66 mmHg  Pulse 51  Temp(Src) 97.9 F (36.6 C) (Oral)  Resp 20  Ht  (1.6 m)  Wt 165 lb (74.844 kg)  BMI 29.24 kg/m2  SpO2 100%  Vitals reviewed Physical Exam  Physical Examination: General appearance - alert, well appearing, and in no distress Mental status - alert, oriented to person, place, and time Eyes - pupils equal and reactive, extraocular eye movements intact Mouth - mucous membranes moist, pharynx normal without lesions Chest - clear to auscultation, no wheezes, rales or rhonchi, symmetric air entry Heart - normal rate, regular rhythm, normal S1, S2, no murmurs, rubs, clicks or gallops Abdomen - soft, nontender, nondistended, no masses or organomegaly Neurological - alert, oriented x 3, left sided facial droop present, strength 5/5 in extremities x 4, sensation intact Musculoskeletal- pain with palpation and ROM  of right knee and right shoulder- other extrmeities xnad joints with FROM and without pain or deformity Extremities - peripheral pulses normal, no pedal edema, no clubbing or cyanosis Skin - normal coloration and turgor, no rashes, mild bruising over right patella and right superior shoulder  ED Course  Procedures (including critical care time)  5:44 AM neurology has seen patient and has cancelled code stroke.  NIH score of 3, last seen normal 10pm, age of 81- not a candidate for TPA per Dr. Roseanne RenoStewart.    This patients CHA2DS2-VASc Score and unadjusted Ischemic Stroke Rate (% per year) is equal to 9.7 % stroke rate/year from a score of 6  Above score calculated as 1 point each if present [CHF, HTN, DM, Vascular=MI/PAD/Aortic Plaque, Age if 65-74, or Female] Above score calculated as 2 points each if present [Age > 75, or Stroke/TIA/TE]  7:18 AM d/w [facility] for admission, they request telemetry bed, temporary admission orders written.    CRITICAL CARE Performed by: Ethelda ChickLINKER,MARTHA K Total critical care time: 35 Critical care time was exclusive of separately billable procedures and treating other patients. Critical care was necessary to treat or prevent imminent or life-threatening deterioration. Critical care was time spent personally by me on the following activities: development of treatment plan with patient and/or surrogate as well as nursing, discussions with consultants, evaluation of patient's response to treatment, examination of patient, obtaining history from patient or surrogate, ordering and performing treatments and interventions, ordering and review of laboratory studies, ordering and review of radiographic studies, pulse oximetry and re-evaluation of patient's condition.  Labs Review Labs Reviewed  CBC - Abnormal; Notable for the following:    WBC 11.3 (*)    All other components within normal limits  COMPREHENSIVE METABOLIC PANEL - Abnormal; Notable for the following:    Glucose, Bld 114 (*)    BUN 24 (*)    Creatinine, Ser 1.57 (*)    GFR calc non Af Amer 28 (*)    GFR calc Af Amer 32 (*)    All other components within normal limits  I-STAT CHEM 8, ED - Abnormal; Notable for the following:    BUN 28 (*)    Creatinine, Ser 1.50 (*)    Glucose, Bld 113 (*)    Calcium, Ion 1.04 (*)    All other components within normal limits  CBG MONITORING, ED - Abnormal; Notable for the following:    Glucose-Capillary 108 (*)    All other components within normal limits  ETHANOL   PROTIME-INR  APTT  DIFFERENTIAL  URINE RAPID DRUG SCREEN (HOSP PERFORMED)  URINALYSIS, ROUTINE W REFLEX MICROSCOPIC  I-STAT TROPOININ, ED  I-STAT TROPOININ, ED    Imaging Review Dg Shoulder Right  09/23/2014   CLINICAL DATA:  Right shoulder pain after fall out of bed.  EXAM: RIGHT SHOULDER - 2+ VIEW  COMPARISON:  None.  FINDINGS: No fracture or dislocation. Transscapular Y-view slightly limited due to soft tissue attenuation. Mild proliferative change at the acromioclavicular joint.  IMPRESSION: No fracture or dislocation of the right shoulder.   Electronically Signed   By: Rubye OaksMelanie  Ehinger M.D.   On: 09/23/2014 06:32   Ct Head Wo Contrast  09/23/2014   CLINICAL DATA:  Code stroke.  Left facial droop.  EXAM: CT HEAD WITHOUT CONTRAST  TECHNIQUE: Contiguous axial images were obtained from the base of the skull through the vertex without intravenous contrast.  COMPARISON:  None.  FINDINGS: No intracranial hemorrhage. There is a questionable small focus of decreased  density in the right posterior limb of the internal capsule that may reflect a small lacunar infarct versus volume averaging. There is no territorial infarct. Mild periventricular white matter change. No hydrocephalus. The basilar cisterns are patent. No intracranial fluid collection. Calvarium is intact. Included paranasal sinuses and mastoid air cells are well aerated.  IMPRESSION: 1. Questionable small lacunar infarct posterior limb right internal capsule. 2. No intracranial hemorrhage. These results were called by telephone at the time of interpretation on 09/23/2014 at 5:33 am to Dr. Karma Ganja, who verbally acknowledged these results.   Electronically Signed   By: Rubye Oaks M.D.   On: 09/23/2014 05:34   Dg Knee Complete 4 Views Right  09/23/2014   CLINICAL DATA:  Right knee pain after fall.  EXAM: RIGHT KNEE - COMPLETE 4+ VIEW  COMPARISON:  None.  FINDINGS: Two partially cannulated screws traverse the proximal tibia, no periprosthetic  lucency or fracture. No fracture or dislocation. The alignment is maintained. There is mild osteoarthritis. No joint effusion.  IMPRESSION: Mild osteoarthritis.  No fracture or dislocation.   Electronically Signed   By: Rubye Oaks M.D.   On: 09/23/2014 06:31     EKG Interpretation   Date/Time:  Sunday September 23 2014 05:40:34 EST Ventricular Rate:  59 PR Interval:    QRS Duration: 88 QT Interval:  551 QTC Calculation: 546 R Axis:   112 Text Interpretation:  Atrial fibrillation Probable lateral infarct, age  indeterminate Prolonged QT interval Artifact in lead(s) I III aVR aVL aVF  V1 V2 V3 No significant change since last tracing Confirmed by [facility]  MD,  MARTHA [phone]) on 09/23/2014 7:03:25 AM      MDM   Final diagnoses:  Fall  facial droop Atrial fibrillation  Pt presents with left sided weakness which has resolved and left facial droop.  Last seen normal at 10pm last night.  Pt seen by neurology, NIH score 3, not a tpa candidate based on LSN time per Dr. Roseanne Reno.  Symptoms are improving.  Pt has hx of afib- on EKG here as well.  Pt is on aspirin daily- given asa in the ED.  Will need further CVA workup.  D/w [facility] for further management.      Ethelda Chick, MD 09/23/14 [phone]

## 2014-09-23 NOTE — H&P (Signed)
PATIENT DETAILS Name: [name] Age: 81 y.o. Sex: female Date of Birth: [birth year] Admit Date: 09/23/2014 ZOX:[id number]:ROBERTS, Vernie AmmonsONALD WAYNE, MD   CHIEF COMPLAINT:  Fall followed by left-sided weakness-around 4 AM this morning  HPI: Catherine BiggerMyrtle V Lemar is a 81 y.o. female with a Past Medical History of atrial fibrillation not on anticoagulation (due to significant epistaxis), hypertension, hypothyroidism who presents today with the above noted complaint. Per patient, she was in her usual state of health, she went to bed last evening without any major issues. Around 4 AM this morning, she sustained a fall while attempting to get out of bed and could not get up. She was subsequently brought to the emergency room, which was found to have left-sided weakness, and left facial droop along with dysarthria. CT scan of the head showed a possible acute infarction involving the posterior right internal capsule. Neurology was consulted, I was asked to admit this patient for further evaluation and treatment. There is no history of headache, chest pain, shortness of breath, nausea, vomiting or diarrhea. There is no history of abdominal pain.   ALLERGIES:   Allergies  Allergen Reactions  . Penicillins Rash    PAST MEDICAL HISTORY: Past Medical History  Diagnosis Date  . Atrial fibrillation   . Hypertension   . Thyroid disease     Hypothyroidism    PAST SURGICAL HISTORY: Past Surgical History  Procedure Laterality Date  . Joint replacement      Hip replacement    MEDICATIONS AT HOME: Prior to Admission medications   Medication Sig Start Date End Date Taking? Authorizing Provider  aspirin 81 MG tablet Take 81 mg by mouth daily.   Yes Historical Provider, MD  digoxin (LANOXIN) 0.125 MG tablet Take 0.125 mg by mouth daily.   Yes Historical Provider, MD  levothyroxine (SYNTHROID, LEVOTHROID) 50 MCG tablet Take 50 mcg by mouth daily before breakfast.   Yes Historical Provider, MD  losartan  (COZAAR) 100 MG tablet Take 100 mg by mouth daily.   Yes Historical Provider, MD  oxyCODONE-acetaminophen (PERCOCET/ROXICET) 5-325 MG per tablet Take 1 tablet by mouth every 8 (eight) hours as needed for moderate pain or severe pain.   Yes Historical Provider, MD  triamterene-hydrochlorothiazide (MAXZIDE) 75-50 MG per tablet Take 1 tablet by mouth daily.   Yes Historical Provider, MD  DIGOXIN PO Take by mouth.    Historical Provider, MD  LEVOTHYROXINE SODIUM PO Take by mouth.    Historical Provider, MD  TRAMADOL HCL PO Take by mouth.    Historical Provider, MD  triamterene-hydrochlorothiazide (DYAZIDE) 50-25 MG per capsule Take 1 capsule by mouth every morning.    Historical Provider, MD  triamterene-hydrochlorothiazide (MAXZIDE-25) 37.5-25 MG per tablet Take 1 tablet by mouth daily.    Historical Provider, MD    FAMILY HISTORY: No family history of CAD  SOCIAL HISTORY:  reports that she does not drink alcohol or use illicit drugs. Her tobacco history is not on file.  REVIEW OF SYSTEMS:  Constitutional:   No  weight loss, night sweats,  Fever  HEENT:    No headaches  Cardio-vascular: No chest pain,  Orthopnea, PND, swelling in lower extremities, anasarc  GI:  No heartburn, indigestion, abdominal pain, nausea, vomiting, diarrhea  Resp: No shortness of breath with exertion or at rest.  No excess mucus, no productive cough, No non-productive cough,  No coughing up of blood.No change in color of mucu  Skin:  no rash or lesions.  GU:  no dysuria, change in color of urine, no urgency or frequency.  No flank pain.  Musculoskeletal: No joint pain or swelling.  No decreased range of motion.  No back pain.  Psych: No change in mood or affect. No depression or anxiety.  No memory loss.   PHYSICAL EXAM: Blood pressure 127/79, pulse 85, temperature 97.4 F (36.3 C), temperature source Axillary, resp. rate 22, height  (1.6 m), weight 74.844 kg (165 lb), SpO2 100 %.  General  appearance :Awake, alert, not in any distress. Mild dysarthria, left-sided facial droop. HEENT: Atraumatic and Normocephalic, pupils equally reactive to light and accomodation Neck: supple, no JVD. No cervical lymphadenopathy.  Chest:Good air entry bilaterally, no added sounds  CVS: S1 S2 regular, no murmurs.  Abdomen: Bowel sounds present, Non tender and not distended with no gaurding, rigidity or rebound. Extremities: B/L Lower Ext shows no edema, both legs are warm to touch Neurology: Left facial droop, dysarthria, mild left upper extremity weakness-approximately 4/5. Right upper and right lower extremity at 5/5. Skin:No Rash Wounds:N/A  LABS ON ADMISSION:   Recent Labs  09/23/14 0511 09/23/14 0519  NA 139 140  K 3.6 3.6  CL 105 105  CO2 22  --   GLUCOSE 114* 113*  BUN 24* 28*  CREATININE 1.57* 1.50*  CALCIUM 9.1  --     Recent Labs  09/23/14 0511  AST 24  ALT 15  ALKPHOS 69  BILITOT 0.8  PROT 6.8  ALBUMIN 3.9   No results for input(s): LIPASE, AMYLASE in the last 72 hours.  Recent Labs  09/23/14 0511 09/23/14 0519  WBC 11.3*  --   NEUTROABS 7.4  --   HGB 12.5 13.6  HCT 38.8 40.0  MCV 93.3  --   PLT 160  --    No results for input(s): CKTOTAL, CKMB, CKMBINDEX, TROPONINI in the last 72 hours. No results for input(s): DDIMER in the last 72 hours. Invalid input(s): POCBNP   RADIOLOGIC STUDIES ON ADMISSION: Dg Shoulder Right  09/23/2014   CLINICAL DATA:  Right shoulder pain after fall out of bed.  EXAM: RIGHT SHOULDER - 2+ VIEW  COMPARISON:  None.  FINDINGS: No fracture or dislocation. Transscapular Y-view slightly limited due to soft tissue attenuation. Mild proliferative change at the acromioclavicular joint.  IMPRESSION: No fracture or dislocation of the right shoulder.   Electronically Signed   By: Rubye Oaks M.D.   On: 09/23/2014 06:32   Ct Head Wo Contrast  09/23/2014   CLINICAL DATA:  Code stroke.  Left facial droop.  EXAM: CT HEAD WITHOUT  CONTRAST  TECHNIQUE: Contiguous axial images were obtained from the base of the skull through the vertex without intravenous contrast.  COMPARISON:  None.  FINDINGS: No intracranial hemorrhage. There is a questionable small focus of decreased density in the right posterior limb of the internal capsule that may reflect a small lacunar infarct versus volume averaging. There is no territorial infarct. Mild periventricular white matter change. No hydrocephalus. The basilar cisterns are patent. No intracranial fluid collection. Calvarium is intact. Included paranasal sinuses and mastoid air cells are well aerated.  IMPRESSION: 1. Questionable small lacunar infarct posterior limb right internal capsule. 2. No intracranial hemorrhage. These results were called by telephone at the time of interpretation on 09/23/2014 at 5:33 am to Dr. Karma Ganja, who verbally acknowledged these results.   Electronically Signed   By: Rubye Oaks M.D.   On: 09/23/2014 05:34   Dg Knee Complete 4 Views  Right  09/23/2014   CLINICAL DATA:  Right knee pain after fall.  EXAM: RIGHT KNEE - COMPLETE 4+ VIEW  COMPARISON:  None.  FINDINGS: Two partially cannulated screws traverse the proximal tibia, no periprosthetic lucency or fracture. No fracture or dislocation. The alignment is maintained. There is mild osteoarthritis. No joint effusion.  IMPRESSION: Mild osteoarthritis.  No fracture or dislocation.   Electronically Signed   By: Rubye Oaks M.D.   On: 09/23/2014 06:31     EKG: Independently reviewed. Atrial fibrillation.  ASSESSMENT AND PLAN: Present on Admission:  . Acute CVA (cerebrovascular accident): Likely cardiac embolic CVA given history of atrial fibrillation. Admit to telemetry, check MRI/MRA brain, echo and carotids. Unfortunately,patient has been on pradaxa and other anticoagulation in the past, per family this was discontinued because of severe epistaxis. Per family, her primary care practitioner has placed on aspirin and  has not rechallenged her on anticoagulation. For now, we'll place on aspirin, she has failed her swallow evaluation and will need therapy services as well. Suspect given her advanced age, history of recurrent epistaxis, she probably will not be a candidate for long-term anticoagulation.   . Atrial fibrillation, unspecified: Continue digoxin, see above regarding anticoagulation. Will check digoxin level.   . Hypertension: Allow permissive hypertension for the first 24 hours or so, will start on very low-dose amlodipine if she is able to  swallow. Hold losartan given acute renal failure.  . ARF (acute renal failure): Hold losartan, gently hydrate and recheck electrolytes in a.m.  Further plan will depend as patient's clinical course evolves and further radiologic and laboratory data become available. Patient will be monitored closely.   Above noted plan was discussed with patient/daughters, they were in agreement.   DVT Prophylaxis: Prophylactic Heparin  Code Status: DNR  Disposition Plan: Likely will require SNF    Total time spent for admission equals 45 minutes.  [facility] Pager [phone]  If 7PM-7AM, please contact night-coverage [email or web address] Password [email or web address] 09/23/2014, 8:04 AM

## 2014-09-23 NOTE — Evaluation (Signed)
Speech Language Pathology Evaluation Patient Details Name: [name] MRN: [id number] DOB: 13-Sep-1920 Today's Date: 09/23/2014 Time: [id number] SLP Time Calculation (min) (ACUTE ONLY): 28 min  Problem List:  Patient Active Problem List   Diagnosis Date Noted  . Atrial fibrillation, unspecified 09/23/2014  . Hypertension 09/23/2014  . Acute CVA (cerebrovascular accident) 09/23/2014  . ARF (acute renal failure) 09/23/2014   Past Medical History:  Past Medical History  Diagnosis Date  . Atrial fibrillation   . Hypertension   . Thyroid disease     Hypothyroidism   Past Surgical History:  Past Surgical History  Procedure Laterality Date  . Joint replacement      Hip replacement   HPI:  [name] is a 81 y.o. female with a Past Medical History of atrial fibrillation not on anticoagulation (due to significant epistaxis), hypertension, hypothyroidism who presents today with the above noted complaint. Per patient, she was in her usual state of health, she went to bed last evening without any major issues. Around 4 AM this morning, she sustained a fall while attempting to get out of bed and could not get up. She was subsequently brought to the emergency room, which was found to have left-sided weakness, and left facial droop along with dysarthria. CT scan of the head showed a possible acute infarction involving the posterior right internal capsule.   Assessment / Plan / Recommendation Clinical Impression  Pt demonstrates moderate dysarthria, right gaze preference with ability to redirect to left with verbal cues, decreased awareness of physical impairment and situation. Pt is able to guide care with verbal requests and is intelligible though speech is slow and imprecise. Recommend CIR at d/c, will continue to follow acutely for speech intelligibility and cognition.     SLP Assessment  Patient needs continued Speech Lanaguage Pathology Services    Follow Up Recommendations  Inpatient Rehab    Frequency and Duration min 2x/week  2 weeks   Pertinent Vitals/Pain Pain Assessment: No/denies pain   SLP Goals  Progression toward goals: Progressing toward goals Potential to Achieve Goals (ACUTE ONLY): Good  SLP Evaluation Prior Functioning  Cognitive/Linguistic Baseline: Within functional limits   Cognition  Overall Cognitive Status: Impaired/Different from baseline Arousal/Alertness: Awake/alert Orientation Level: Oriented to person;Oriented to place;Oriented to situation;Disoriented to time Attention: Sustained;Selective Sustained Attention: Appears intact Selective Attention: Appears intact Memory: Impaired Memory Impairment: Decreased recall of new information Awareness: Impaired Awareness Impairment: Intellectual impairment;Emergent impairment    Comprehension  Auditory Comprehension Overall Auditory Comprehension: Appears within functional limits for tasks assessed    Expression Verbal Expression Overall Verbal Expression: Appears within functional limits for tasks assessed   Oral / Motor Oral Motor/Sensory Function Overall Oral Motor/Sensory Function: Impaired Labial ROM: Reduced left Labial Symmetry: Abnormal symmetry left Labial Strength: Reduced Labial Sensation: Reduced Lingual ROM: Reduced left Lingual Symmetry: Abnormal symmetry left Lingual Strength: Reduced Lingual Sensation: Reduced Facial ROM: Reduced left Facial Symmetry: Left droop Facial Strength: Reduced Facial Sensation: Reduced Motor Speech Overall Motor Speech: Impaired Respiration: Within functional limits Phonation: Normal Resonance: Within functional limits Articulation: Impaired Level of Impairment: Word Intelligibility: Intelligible Motor Planning: Witnin functional limits Motor Speech Errors: Aware   GO    Harlon DittyBonnie Paton Crum, MA CCC-SLP [phone]  Claudine MoutonDeBlois, Kouper Spinella Caroline 09/23/2014, 1:56 PM

## 2014-09-23 NOTE — ED Notes (Signed)
Admitting dr notified of BP-- hold apresoline unless BP is CONSISTENTLY systolic greater than 200. Will notify 4N charge nurse

## 2014-09-23 NOTE — ED Notes (Signed)
Dr. Roseanne RenoStewart notified of pt's condition. Change ASA po to ASA pr.

## 2014-09-23 NOTE — ED Notes (Addendum)
Pt CBG, 108. Nurse was notified. 

## 2014-09-23 NOTE — ED Notes (Signed)
Questioned MRI and per dr linker sts it is not an emergent order and should be done on floor or on admision

## 2014-09-23 NOTE — ED Notes (Signed)
MD at bedside.Dr. Karma Ganjalinker

## 2014-09-24 ENCOUNTER — Encounter (HOSPITAL_COMMUNITY): Payer: Self-pay | Admitting: *Deleted

## 2014-09-24 ENCOUNTER — Inpatient Hospital Stay (HOSPITAL_COMMUNITY): Payer: Medicare HMO

## 2014-09-24 DIAGNOSIS — I4891 Unspecified atrial fibrillation: Secondary | ICD-10-CM

## 2014-09-24 DIAGNOSIS — I6789 Other cerebrovascular disease: Secondary | ICD-10-CM

## 2014-09-24 DIAGNOSIS — R1314 Dysphagia, pharyngoesophageal phase: Secondary | ICD-10-CM | POA: Diagnosis present

## 2014-09-24 LAB — GLUCOSE, CAPILLARY: GLUCOSE-CAPILLARY: 108 mg/dL — AB (ref 70–99)

## 2014-09-24 LAB — BASIC METABOLIC PANEL
Anion gap: 11 (ref 5–15)
BUN: 20 mg/dL (ref 6–23)
CO2: 24 mmol/L (ref 19–32)
Calcium: 8.9 mg/dL (ref 8.4–10.5)
Chloride: 108 mmol/L (ref 96–112)
Creatinine, Ser: 1.38 mg/dL — ABNORMAL HIGH (ref 0.50–1.10)
GFR calc non Af Amer: 32 mL/min — ABNORMAL LOW (ref >90)
GFR, EST AFRICAN AMERICAN: 37 mL/min — AB (ref >90)
Glucose, Bld: 118 mg/dL — ABNORMAL HIGH (ref 70–99)
Potassium: 3.2 mmol/L — ABNORMAL LOW (ref 3.5–5.1)
SODIUM: 143 mmol/L (ref 135–145)

## 2014-09-24 LAB — LIPID PANEL
Cholesterol: 141 mg/dL (ref 0–200)
HDL: 38 mg/dL — AB (ref >39)
LDL Cholesterol: 84 mg/dL (ref 0–99)
Total CHOL/HDL Ratio: 3.7 RATIO
Triglycerides: 97 mg/dL (ref <150)
VLDL: 19 mg/dL (ref 0–40)

## 2014-09-24 MED ORDER — CETYLPYRIDINIUM CHLORIDE 0.05 % MT LIQD
7.0000 mL | Freq: Two times a day (BID) | OROMUCOSAL | Status: DC
  Administered 2014-09-25 – 2014-09-26 (×4): 7 mL via OROMUCOSAL

## 2014-09-24 MED ORDER — RESOURCE THICKENUP CLEAR PO POWD
ORAL | Status: DC | PRN
  Filled 2014-09-24: qty 125

## 2014-09-24 MED ORDER — HYDRALAZINE HCL 20 MG/ML IJ SOLN
2.0000 mg | Freq: Once | INTRAMUSCULAR | Status: AC
  Administered 2014-09-24: 2 mg via INTRAVENOUS
  Filled 2014-09-24: qty 1

## 2014-09-24 MED ORDER — HYDRALAZINE HCL 20 MG/ML IJ SOLN
10.0000 mg | Freq: Four times a day (QID) | INTRAMUSCULAR | Status: DC | PRN
  Administered 2014-09-24 – 2014-09-25 (×3): 10 mg via INTRAVENOUS
  Filled 2014-09-24 (×4): qty 1

## 2014-09-24 MED ORDER — MORPHINE SULFATE 2 MG/ML IJ SOLN
1.0000 mg | INTRAMUSCULAR | Status: DC | PRN
  Administered 2014-09-24: 1 mg via INTRAVENOUS
  Administered 2014-09-25: 2 mg via INTRAVENOUS
  Administered 2014-09-26: 1 mg via INTRAVENOUS
  Filled 2014-09-24 (×3): qty 1

## 2014-09-24 MED ORDER — POTASSIUM CHLORIDE 10 MEQ/100ML IV SOLN
10.0000 meq | INTRAVENOUS | Status: AC
  Administered 2014-09-24 (×3): 10 meq via INTRAVENOUS
  Filled 2014-09-24 (×3): qty 100

## 2014-09-24 MED ORDER — HYDRALAZINE HCL 20 MG/ML IJ SOLN
10.0000 mg | Freq: Once | INTRAMUSCULAR | Status: AC
  Administered 2014-09-24: 10 mg via INTRAVENOUS

## 2014-09-24 MED ORDER — MORPHINE SULFATE 2 MG/ML IJ SOLN
1.0000 mg | Freq: Once | INTRAMUSCULAR | Status: AC
  Administered 2014-09-24: 1 mg via INTRAVENOUS
  Filled 2014-09-24: qty 1

## 2014-09-24 MED ORDER — CHLORHEXIDINE GLUCONATE 0.12 % MT SOLN
15.0000 mL | Freq: Two times a day (BID) | OROMUCOSAL | Status: DC
  Administered 2014-09-24 – 2014-09-26 (×4): 15 mL via OROMUCOSAL
  Filled 2014-09-24 (×3): qty 15

## 2014-09-24 NOTE — Progress Notes (Signed)
STROKE TEAM PROGRESS NOTE   HISTORY [name] is an 81 y.o. female with history of atrial fibrillation on anticoagulation, hypertension and hypothyroidism, presenting with new onset left facial droop and slurred speech. Patient also had left-sided weakness and fell when attempted to get out of bed. She was last seen well at 10 PM on 09/22/2014. She's been on aspirin daily. CT scan of her head and equivocal acute infarction involving the posterior right internal capsule. NIH stroke score was 3. She was beyond time window for treatment consideration with TPA. She was admitted for further evaluation and treatment.   SUBJECTIVE (INTERVAL HISTORY) Her family is at the bedside.  Overall she feels her condition is unchanged. She failed her swallow and is currently NPO. Family appear surprised at her new dx. Patient had refused to take coumadin. She was on aspirin. Had bed on pradaxa. She lives alone PTA. Walked with a walker. Daughter next door would do most of the cooking.    OBJECTIVE Temp:  [98.6 F (37 C)-99.4 F (37.4 C)] 99.4 F (37.4 C) (02/08 0611) Pulse Rate:  [64-78] 73 (02/08 1138) Cardiac Rhythm:  [-] Atrial fibrillation (02/07 2000) Resp:  [14-20] 20 (02/08 0611) BP: (169-220)/(42-86) 208/84 mmHg (02/08 1138) SpO2:  [93 %-98 %] 96 % (02/08 0611)   Recent Labs Lab 09/23/14 0536 09/23/14 1639 09/23/14 2146 09/24/14 0657  GLUCAP 108* 97 109* 108*    Recent Labs Lab 09/23/14 0511 09/23/14 0519 09/24/14 0751  NA 139 140 143  K 3.6 3.6 3.2*  CL 105 105 108  CO2 22  --  24  GLUCOSE 114* 113* 118*  BUN 24* 28* 20  CREATININE 1.57* 1.50* 1.38*  CALCIUM 9.1  --  8.9    Recent Labs Lab 09/23/14 0511  AST 24  ALT 15  ALKPHOS 69  BILITOT 0.8  PROT 6.8  ALBUMIN 3.9    Recent Labs Lab 09/23/14 0511 09/23/14 0519  WBC 11.3*  --   NEUTROABS 7.4  --   HGB 12.5 13.6  HCT 38.8 40.0  MCV 93.3  --   PLT 160  --    No results for input(s): CKTOTAL, CKMB,  CKMBINDEX, TROPONINI in the last 168 hours.  Recent Labs  09/23/14 0511  LABPROT 14.0  INR 1.07    Recent Labs  09/23/14 0732  COLORURINE YELLOW  LABSPEC 1.010  PHURINE 5.5  GLUCOSEU NEGATIVE  HGBUR NEGATIVE  BILIRUBINUR NEGATIVE  KETONESUR NEGATIVE  PROTEINUR NEGATIVE  UROBILINOGEN 0.2  NITRITE NEGATIVE  LEUKOCYTESUR NEGATIVE       Component Value Date/Time   CHOL 141 09/24/2014 0751   TRIG 97 09/24/2014 0751   HDL 38* 09/24/2014 0751   CHOLHDL 3.7 09/24/2014 0751   VLDL 19 09/24/2014 0751   LDLCALC 84 09/24/2014 0751   No results found for: HGBA1C    Component Value Date/Time   LABOPIA NONE DETECTED 09/23/2014 0732   COCAINSCRNUR NONE DETECTED 09/23/2014 0732   LABBENZ NONE DETECTED 09/23/2014 0732   AMPHETMU NONE DETECTED 09/23/2014 0732   THCU NONE DETECTED 09/23/2014 0732   LABBARB NONE DETECTED 09/23/2014 0732     Recent Labs Lab 09/23/14 0511  ETH <5    Dg Shoulder Right  09/23/2014   CLINICAL DATA:  Right shoulder pain after fall out of bed.  EXAM: RIGHT SHOULDER - 2+ VIEW  COMPARISON:  None.  FINDINGS: No fracture or dislocation. Transscapular Y-view slightly limited due to soft tissue attenuation. Mild proliferative change at the acromioclavicular joint.  IMPRESSION: No fracture or dislocation of the right shoulder.   Electronically Signed   By: Rubye Oaks M.D.   On: 09/23/2014 06:32   Ct Head Wo Contrast  09/23/2014   CLINICAL DATA:  Code stroke.  Left facial droop.  EXAM: CT HEAD WITHOUT CONTRAST  TECHNIQUE: Contiguous axial images were obtained from the base of the skull through the vertex without intravenous contrast.  COMPARISON:  None.  FINDINGS: No intracranial hemorrhage. There is a questionable small focus of decreased density in the right posterior limb of the internal capsule that may reflect a small lacunar infarct versus volume averaging. There is no territorial infarct. Mild periventricular white matter change. No hydrocephalus. The  basilar cisterns are patent. No intracranial fluid collection. Calvarium is intact. Included paranasal sinuses and mastoid air cells are well aerated.  IMPRESSION: 1. Questionable small lacunar infarct posterior limb right internal capsule. 2. No intracranial hemorrhage. These results were called by telephone at the time of interpretation on 09/23/2014 at 5:33 am to Dr. Karma Ganja, who verbally acknowledged these results.   Electronically Signed   By: Rubye Oaks M.D.   On: 09/23/2014 05:34   Mr Brain Wo Contrast  09/23/2014   CLINICAL DATA:  Left-sided weakness and left facial droop with dysarthria. Stroke.  EXAM: MRI HEAD WITHOUT CONTRAST  MRA HEAD WITHOUT CONTRAST  TECHNIQUE: Multiplanar, multiecho pulse sequences of the brain and surrounding structures were obtained without intravenous contrast. Angiographic images of the head were obtained using MRA technique without contrast.  COMPARISON:  Head CT 09/23/2014  FINDINGS: MRI HEAD FINDINGS  The pituitary gland is prominent in size and appears asymmetrically enlarged on the right with a convex superior margin and heterogeneity suggestive of an underlying lesion. The right aspect of the gland measures 10 mm in craniocaudal height. There is no evidence of suprasellar extension. There are small regions of restricted diffusion in the right MCA territory involving right frontal cortex and white matter, external capsule, insula, and temporal operculum. Small foci of chronic microhemorrhage are noted in the left frontal lobe and right parietal lobe. Mild generalized cerebral atrophy is within normal limits for age. There is no evidence of mass, midline shift, or extra-axial fluid collection. Small foci of T2 hyperintensity in the cerebral white matter bilaterally are nonspecific but compatible with mild chronic small vessel ischemic disease.  Prior bilateral cataract extraction is noted. Paranasal sinuses are clear. There is trace right mastoid air cell fluid. Abnormal  right MCA branch vessel flow voids are more fully evaluated below.  MRA HEAD FINDINGS  The visualized distal vertebral arteries are patent with the right being slightly dominant. PICA and SCA origins are patent. Basilar artery is patent without stenosis. There is a fetal type origin of the right PCA. PCAs are otherwise unremarkable. Both the  Internal carotid arteries are patent from skullbase to carotid termini. Right M1 segment is patent without stenosis. There is occlusion of the right M2 inferior division trunk just beyond its origin. The superior division appears patent. ACAs and left MCA are unremarkable. No intracranial aneurysm is identified.  IMPRESSION: 1. Small areas of acute infarction in the right MCA territory. 2. Occlusion of the right M2 inferior division. 3. Asymmetric, mild enlargement of the pituitary gland on the right suggestive of an underlying lesion, such as a microadenoma. If clinically warranted, elective pituitary protocol brain MRI could be performed for further evaluation.   Electronically Signed   By: Sebastian Ache   On: 09/23/2014 11:11   Dg  Knee Complete 4 Views Right  09/23/2014   CLINICAL DATA:  Right knee pain after fall.  EXAM: RIGHT KNEE - COMPLETE 4+ VIEW  COMPARISON:  None.  FINDINGS: Two partially cannulated screws traverse the proximal tibia, no periprosthetic lucency or fracture. No fracture or dislocation. The alignment is maintained. There is mild osteoarthritis. No joint effusion.  IMPRESSION: Mild osteoarthritis.  No fracture or dislocation.   Electronically Signed   By: Rubye OaksMelanie  Ehinger M.D.   On: 09/23/2014 06:31   Mr Maxine GlennMra Head/brain Wo Cm  09/23/2014   CLINICAL DATA:  Left-sided weakness and left facial droop with dysarthria. Stroke.  EXAM: MRI HEAD WITHOUT CONTRAST  MRA HEAD WITHOUT CONTRAST  TECHNIQUE: Multiplanar, multiecho pulse sequences of the brain and surrounding structures were obtained without intravenous contrast. Angiographic images of the head were  obtained using MRA technique without contrast.  COMPARISON:  Head CT 09/23/2014  FINDINGS: MRI HEAD FINDINGS  The pituitary gland is prominent in size and appears asymmetrically enlarged on the right with a convex superior margin and heterogeneity suggestive of an underlying lesion. The right aspect of the gland measures 10 mm in craniocaudal height. There is no evidence of suprasellar extension. There are small regions of restricted diffusion in the right MCA territory involving right frontal cortex and white matter, external capsule, insula, and temporal operculum. Small foci of chronic microhemorrhage are noted in the left frontal lobe and right parietal lobe. Mild generalized cerebral atrophy is within normal limits for age. There is no evidence of mass, midline shift, or extra-axial fluid collection. Small foci of T2 hyperintensity in the cerebral white matter bilaterally are nonspecific but compatible with mild chronic small vessel ischemic disease.  Prior bilateral cataract extraction is noted. Paranasal sinuses are clear. There is trace right mastoid air cell fluid. Abnormal right MCA branch vessel flow voids are more fully evaluated below.  MRA HEAD FINDINGS  The visualized distal vertebral arteries are patent with the right being slightly dominant. PICA and SCA origins are patent. Basilar artery is patent without stenosis. There is a fetal type origin of the right PCA. PCAs are otherwise unremarkable. Both the  Internal carotid arteries are patent from skullbase to carotid termini. Right M1 segment is patent without stenosis. There is occlusion of the right M2 inferior division trunk just beyond its origin. The superior division appears patent. ACAs and left MCA are unremarkable. No intracranial aneurysm is identified.  IMPRESSION: 1. Small areas of acute infarction in the right MCA territory. 2. Occlusion of the right M2 inferior division. 3. Asymmetric, mild enlargement of the pituitary gland on the  right suggestive of an underlying lesion, such as a microadenoma. If clinically warranted, elective pituitary protocol brain MRI could be performed for further evaluation.   Electronically Signed   By: Sebastian AcheAllen  Grady   On: 09/23/2014 11:11   Carotid Doppler  There is 1-39% bilateral ICA stenosis. Vertebral artery flow is antegrade.    2D Echocardiogram  EF 60-65% with no source of embolus.    PHYSICAL EXAM Frail elderly female not in distress. . Afebrile. Head is nontraumatic. Neck is supple without bruit.    Cardiac exam no murmur or gallop. Lungs are clear to auscultation. Distal pulses are well felt. Neurological Exam : Awake alert oriented x 3 normal speech and language. Mild left lower face asymmetry. Tongue midline. No drift. Mild diminished fine finger movements on left. Orbits right over left upper extremity. Mild left grip weak.. Left hemi body decreased sensation . Impared  LT finger to nose coordination.. Gait deferred.  ASSESSMENT/PLAN [name] is a 81 y.o. female with history of atrial fibrillation on anticoagulation, hypertension and hypothyroidism, presenting with new onset left facial droop and slurred speech. She did not receive IV t-PA due to beyond the time window.   Stroke:  Non-dominant right MCA infarct w/ M2 occlusion, embolic secondary to known atrial fibrillation   Resultant  Left hemiparesis, L hemisensory deficit, L ataxia, dysphagia  MRI  Small R MCA infarcts. R pituitary microadenoma  MRA  R M2 occlusion  Carotid Doppler  No significant stenosis   2D Echo  No source of embolus   HgbA1c pending  Heparin 5000 units sq tid for VTE prophylaxis  Diet NPO time specified , ST following swallow  aspirin 81 mg orally every day prior to admission, now on aspirin 300 mg suppository daily  Ongoing aggressive stroke risk factor management  Therapy recommendations:  pending   Disposition:  SNF vs CIR. Await evals.  Atrial Fibrillation  Home meds:   Aspirin, continued in the hospital  Had refused to take coumadin. Had bled on pradaxa. Given age, recommend eliquis 2.5 mg daily once able to swallow.   Accelerated Hypertension   209/70 on arrival  BP 166-225/54-92 past 24h (09/24/2014 @ 2:57 PM)   Permissive hypertension (OK if < 220/120) but gradually normalize in 5-7 days   Hyperlipidemia  Home meds:  No statin  LDL 84, goal < 70  Add statin once able to swallow  Continue statin at discharge  Other Stroke Risk Factors  Advanced age  Other Active Problems  R pituitary microadenoma  Acute renal failure, holding losartan  Hospital day # 1  [facility] See Amion for Pager information 09/24/2014 12:44 PM  I have personally examined this patient, reviewed notes, independently viewed imaging studies, participated in medical decision making and plan of care. I have made any additions or clarifications directly to the above note. Agree with note above. She has had an embolic right MCA infarct secondary to cardiogenic embolism from atrial fibrillation. She remains at high risk for recurrent strokes as well as she is at high risk for hemorrhagic complications given her advanced age. She has an incidental pituitary adenoma which can be managed conservatively given her advanced age and comorbidities.  Delia Heady, MD Medical Director [facility] Pager: [phone] 09/24/2014 3:18 PM    To contact Stroke Continuity provider, please refer to [email or web address]. After hours, contact General Neurology

## 2014-09-24 NOTE — Progress Notes (Signed)
Patient ID: [name]  female  JXB:[id number]    DOB: 12/30/1920    DOA: 09/23/2014  PCP: Lorenda PeckOBERTS, RONALD WAYNE, MD  Brief history of present illness [name] is a 81 y.o. female with a Past Medical History of atrial fibrillation not on anticoagulation (due to significant epistaxis), hypertension, hypothyroidism who presents today with the above noted complaint. Per patient, she was in her usual state of health, she went to bed the night before the admission without any major issues. Around 4 AM on the morning of admission, she sustained a fall while attempting to get out of bed and could not get up. She was subsequently brought to the emergency room, which was found to have left-sided weakness, and left facial droop along with dysarthria. CT scan of the head showed a possible acute infarction involving the posterior right internal capsule. Neurology was consulted  Assessment/Plan: Principal Problem:   Acute CVA (cerebrovascular accident): Likely cardioembolic CVA given history of atrial fibrillation, worsening of symptoms? (Per admission history had mild left upper extremity weakness however 0/5 at my exam, LLE 3/5) - MRI of the brain showed small areas of acute infarction in the right MCA territory - MRA showed occlusion of right M2 inferior division, microadenoma of the pituitary gland -2-D echo pending  -Carotid Dopplers showed 1-39% ICA stenosis   - Patient has significant dysarthria with dysphagia, residual from acute CVA, awaiting formal MBS and swallow evaluation, may need temporary feeding tube - Continue aspirin PR -    lipid panel showed LDL 84, goal <70, will need statin however npo \ - Hemoglobin A1c pending, PTOT evaluation pending   Active Problems:   Atrial fibrillation, unspecified - Continue digoxin, npo - Patient has been on anticoagulation in the past, PRADAXA however per family, this was discontinued due to severe epistaxis. Her PCP had placed her on aspirin.  Will await further neurology recommendations, given her history of recurrent epistaxis, age may not be a candidate for long-term anticoagulation    Hypertension - For now placed on IV hydralazine with parameters    ARF (acute renal failure) - Continue to hold losartan, gentle hydration due to NPO status    Dysphagia, pharyngoesophageal phase - Await formal swallow evaluation, MBS, may need temporary feeding tube in 24-48hrs if fails, patient and family okay with it.   DVT Prophylaxis:Heparin subcutaneous   Code Status:DO NOT RESUSCITATE   Family Communication:Discussed with patient's family at the bedside   Disposition:Patient lives alone, will definitely need rehabilitation/skilled nursing facility   Consultants:  Neurology   Procedures:  MRI of brain, carotid Dopplers   Antibiotics:  none    Subjective:  patient seen and examined, alert and awake, oriented but significant dysarthria.    Objective Weight change:  No intake or output data in the 24 hours ending 09/24/14 1059 Blood pressure 202/66, pulse 73, temperature 99.4 F (37.4 C), temperature source Axillary, resp. rate 20, height 5\' 3"  (1.6 m), weight 74.844 kg (165 lb), SpO2 96 %.  Physical Exam: General: Alert and awake, oriented, dysarthria, left-sided facial droop  CVS: S1-S2 clear, no murmur rubs or gallops Chest: clear to auscultation bilaterally, no wheezing, rales or rhonchi Abdomen: soft nontender, nondistended, normal bowel sounds  Extremities: no cyanosis, clubbing or edema noted bilaterally Neuro: left upper extremity 0/5,  left lower extremities 3/5, right upper and lower extremities 5/5, dysarthria, facial drooping   Lab Results: Basic Metabolic Panel:  Recent Labs Lab 09/23/14 0511 09/23/14 0519 09/24/14 0751  NA 139 140 143  K 3.6 3.6 3.2*  CL 105 105 108  CO2 22  --  24  GLUCOSE 114* 113* 118*  BUN 24* 28* 20  CREATININE 1.57* 1.50* 1.38*  CALCIUM 9.1  --  8.9   Liver  Function Tests:  Recent Labs Lab 09/23/14 0511  AST 24  ALT 15  ALKPHOS 69  BILITOT 0.8  PROT 6.8  ALBUMIN 3.9   No results for input(s): LIPASE, AMYLASE in the last 168 hours. No results for input(s): AMMONIA in the last 168 hours. CBC:  Recent Labs Lab 09/23/14 0511 09/23/14 0519  WBC 11.3*  --   NEUTROABS 7.4  --   HGB 12.5 13.6  HCT 38.8 40.0  MCV 93.3  --   PLT 160  --    Cardiac Enzymes: No results for input(s): CKTOTAL, CKMB, CKMBINDEX, TROPONINI in the last 168 hours. BNP: Invalid input(s): POCBNP CBG:  Recent Labs Lab 09/23/14 0536 09/23/14 1639 09/23/14 2146 09/24/14 0657  GLUCAP 108* 97 109* 108*     Micro Results: No results found for this or any previous visit (from the past 240 hour(s)).  Studies/Results: Dg Shoulder Right  09/23/2014   CLINICAL DATA:  Right shoulder pain after fall out of bed.  EXAM: RIGHT SHOULDER - 2+ VIEW  COMPARISON:  None.  FINDINGS: No fracture or dislocation. Transscapular Y-view slightly limited due to soft tissue attenuation. Mild proliferative change at the acromioclavicular joint.  IMPRESSION: No fracture or dislocation of the right shoulder.   Electronically Signed   By: Rubye Oaks M.D.   On: 09/23/2014 06:32   Ct Head Wo Contrast  09/23/2014   CLINICAL DATA:  Code stroke.  Left facial droop.  EXAM: CT HEAD WITHOUT CONTRAST  TECHNIQUE: Contiguous axial images were obtained from the base of the skull through the vertex without intravenous contrast.  COMPARISON:  None.  FINDINGS: No intracranial hemorrhage. There is a questionable small focus of decreased density in the right posterior limb of the internal capsule that may reflect a small lacunar infarct versus volume averaging. There is no territorial infarct. Mild periventricular white matter change. No hydrocephalus. The basilar cisterns are patent. No intracranial fluid collection. Calvarium is intact. Included paranasal sinuses and mastoid air cells are well  aerated.  IMPRESSION: 1. Questionable small lacunar infarct posterior limb right internal capsule. 2. No intracranial hemorrhage. These results were called by telephone at the time of interpretation on 09/23/2014 at 5:33 am to Dr. Karma Ganja, who verbally acknowledged these results.   Electronically Signed   By: Rubye Oaks M.D.   On: 09/23/2014 05:34   Mr Brain Wo Contrast  09/23/2014   CLINICAL DATA:  Left-sided weakness and left facial droop with dysarthria. Stroke.  EXAM: MRI HEAD WITHOUT CONTRAST  MRA HEAD WITHOUT CONTRAST  TECHNIQUE: Multiplanar, multiecho pulse sequences of the brain and surrounding structures were obtained without intravenous contrast. Angiographic images of the head were obtained using MRA technique without contrast.  COMPARISON:  Head CT 09/23/2014  FINDINGS: MRI HEAD FINDINGS  The pituitary gland is prominent in size and appears asymmetrically enlarged on the right with a convex superior margin and heterogeneity suggestive of an underlying lesion. The right aspect of the gland measures 10 mm in craniocaudal height. There is no evidence of suprasellar extension. There are small regions of restricted diffusion in the right MCA territory involving right frontal cortex and white matter, external capsule, insula, and temporal operculum. Small foci of chronic microhemorrhage are noted in  the left frontal lobe and right parietal lobe. Mild generalized cerebral atrophy is within normal limits for age. There is no evidence of mass, midline shift, or extra-axial fluid collection. Small foci of T2 hyperintensity in the cerebral white matter bilaterally are nonspecific but compatible with mild chronic small vessel ischemic disease.  Prior bilateral cataract extraction is noted. Paranasal sinuses are clear. There is trace right mastoid air cell fluid. Abnormal right MCA branch vessel flow voids are more fully evaluated below.  MRA HEAD FINDINGS  The visualized distal vertebral arteries are patent  with the right being slightly dominant. PICA and SCA origins are patent. Basilar artery is patent without stenosis. There is a fetal type origin of the right PCA. PCAs are otherwise unremarkable. Both the  Internal carotid arteries are patent from skullbase to carotid termini. Right M1 segment is patent without stenosis. There is occlusion of the right M2 inferior division trunk just beyond its origin. The superior division appears patent. ACAs and left MCA are unremarkable. No intracranial aneurysm is identified.  IMPRESSION: 1. Small areas of acute infarction in the right MCA territory. 2. Occlusion of the right M2 inferior division. 3. Asymmetric, mild enlargement of the pituitary gland on the right suggestive of an underlying lesion, such as a microadenoma. If clinically warranted, elective pituitary protocol brain MRI could be performed for further evaluation.   Electronically Signed   By: Sebastian Ache   On: 09/23/2014 11:11   Dg Knee Complete 4 Views Right  09/23/2014   CLINICAL DATA:  Right knee pain after fall.  EXAM: RIGHT KNEE - COMPLETE 4+ VIEW  COMPARISON:  None.  FINDINGS: Two partially cannulated screws traverse the proximal tibia, no periprosthetic lucency or fracture. No fracture or dislocation. The alignment is maintained. There is mild osteoarthritis. No joint effusion.  IMPRESSION: Mild osteoarthritis.  No fracture or dislocation.   Electronically Signed   By: Rubye Oaks M.D.   On: 09/23/2014 06:31   Mr Maxine Glenn Head/brain Wo Cm  09/23/2014   CLINICAL DATA:  Left-sided weakness and left facial droop with dysarthria. Stroke.  EXAM: MRI HEAD WITHOUT CONTRAST  MRA HEAD WITHOUT CONTRAST  TECHNIQUE: Multiplanar, multiecho pulse sequences of the brain and surrounding structures were obtained without intravenous contrast. Angiographic images of the head were obtained using MRA technique without contrast.  COMPARISON:  Head CT 09/23/2014  FINDINGS: MRI HEAD FINDINGS  The pituitary gland is prominent  in size and appears asymmetrically enlarged on the right with a convex superior margin and heterogeneity suggestive of an underlying lesion. The right aspect of the gland measures 10 mm in craniocaudal height. There is no evidence of suprasellar extension. There are small regions of restricted diffusion in the right MCA territory involving right frontal cortex and white matter, external capsule, insula, and temporal operculum. Small foci of chronic microhemorrhage are noted in the left frontal lobe and right parietal lobe. Mild generalized cerebral atrophy is within normal limits for age. There is no evidence of mass, midline shift, or extra-axial fluid collection. Small foci of T2 hyperintensity in the cerebral white matter bilaterally are nonspecific but compatible with mild chronic small vessel ischemic disease.  Prior bilateral cataract extraction is noted. Paranasal sinuses are clear. There is trace right mastoid air cell fluid. Abnormal right MCA branch vessel flow voids are more fully evaluated below.  MRA HEAD FINDINGS  The visualized distal vertebral arteries are patent with the right being slightly dominant. PICA and SCA origins are patent. Basilar artery is patent  without stenosis. There is a fetal type origin of the right PCA. PCAs are otherwise unremarkable. Both the  Internal carotid arteries are patent from skullbase to carotid termini. Right M1 segment is patent without stenosis. There is occlusion of the right M2 inferior division trunk just beyond its origin. The superior division appears patent. ACAs and left MCA are unremarkable. No intracranial aneurysm is identified.  IMPRESSION: 1. Small areas of acute infarction in the right MCA territory. 2. Occlusion of the right M2 inferior division. 3. Asymmetric, mild enlargement of the pituitary gland on the right suggestive of an underlying lesion, such as a microadenoma. If clinically warranted, elective pituitary protocol brain MRI could be performed  for further evaluation.   Electronically Signed   By: Sebastian Ache   On: 09/23/2014 11:11    Medications: Scheduled Meds: . amLODipine  2.5 mg Oral Daily  . aspirin  300 mg Rectal Daily   Or  . aspirin  325 mg Oral Daily  . digoxin  0.125 mg Oral Daily  . heparin  5,000 Units Subcutaneous 3 times per day  . levothyroxine  50 mcg Oral QAC breakfast    time spent 25 minutes    LOS: 1 day   Omer Puccinelli M.D. [facility] 09/24/2014, 10:59 AM Pager: [phone]  If 7PM-7AM, please contact night-coverage [email or web address] Password [email or web address]

## 2014-09-24 NOTE — Evaluation (Signed)
Physical Therapy Evaluation Patient Details Name: [name] MRN: [id number] DOB: 04/12/1921 Today's Date: 09/24/2014   History of Present Illness  Adm 09/23/14 with Lt sided weakness and difficulty speaking. MRI + small Rt MCA distribution infarcts (frontal, external capsule, insula). Also enlarged pituatary gland. BPs running >200 despite IV meds. PMHx- HTN, afib (not anticoagulated due to severe epistaxis), Rt THA  Clinical Impression  Pt admitted with above diagnosis. PT eval limited to bed level activities only as pt's BP increased from 181/64 to 202/64 with only supine activities. RN made aware. Pt currently with functional limitations due to the deficits listed below (see PT Problem List).  Pt will benefit from skilled PT to increase their independence and safety with mobility to allow discharge to the venue listed below.       Follow Up Recommendations  (TBA as medically tolerates incr activity)    Equipment Recommendations  None recommended by PT    Recommendations for Other Services OT consult     Precautions / Restrictions Precautions Precautions: Fall Precaution Comments: pt reports fell on Rt shoulder "when this happened" +bruise      Mobility  Bed Mobility Overal bed mobility: Needs Assistance Bed Mobility: Rolling Rolling: Mod assist         General bed mobility comments: Lt- supervision with rail; Rt- mod assist  Transfers                 General transfer comment: unable to assess  Ambulation/Gait                Stairs            Wheelchair Mobility    Modified Rankin (Stroke Patients Only) Modified Rankin (Stroke Patients Only) Pre-Morbid Rankin Score: Moderate disability Modified Rankin: Severe disability     Balance                                             Pertinent Vitals/Pain Pain Assessment: 0-10 Pain Score: 8  Pain Location: head Pain Intervention(s): Limited activity within patient's  tolerance;Monitored during session;Repositioned;Patient requesting pain meds-RN notified    Home Living Family/patient expects to be discharged to:: Private residence Living Arrangements: Alone   Type of Home: House Home Access: Stairs to enter Entrance Stairs-Rails: Right Entrance Stairs-Number of Steps: 3 Home Layout: One level Home Equipment: Walker - 4 wheels;Cane - single point      Prior Function Level of Independence: Needs assistance   Gait / Transfers Assistance Needed: assist on stairs; used rollator all the time           Hand Dominance   Dominant Hand: Right    Extremity/Trunk Assessment   Upper Extremity Assessment: Defer to OT evaluation;LUE deficits/detail (correctly identified lt touch at shoulder; absent hand)           Lower Extremity Assessment: Generalized weakness;LLE deficits/detail   LLE Deficits / Details: AAROM WFL; hip flexion 2+, knee extension 3-, ankle DF 3+  Cervical / Trunk Assessment: Other exceptions  Communication   Communication: Expressive difficulties;HOH  Cognition Arousal/Alertness: Awake/alert Behavior During Therapy: WFL for tasks assessed/performed Overall Cognitive Status: Impaired/Different from baseline Area of Impairment: Awareness           Awareness:  (not aware of severe numbness Lt side)        General Comments General comments (skin integrity, edema, etc.): Daughter  present and confirmed pt's answers related to prior status. Educated on role of PT in acute setting and frequency of visits    Exercises        Assessment/Plan    PT Assessment Patient needs continued PT services  PT Diagnosis Hemiplegia non-dominant side   PT Problem List Decreased strength;Decreased activity tolerance;Decreased balance;Decreased mobility;Decreased cognition;Decreased coordination;Decreased knowledge of use of DME;Cardiopulmonary status limiting activity;Impaired sensation;Obesity  PT Treatment Interventions DME  instruction;Gait training;Functional mobility training;Therapeutic activities;Balance training;Neuromuscular re-education;Cognitive remediation;Patient/family education   PT Goals (Current goals can be found in the Care Plan section) Acute Rehab PT Goals Patient Stated Goal: wants her head to stop hurting PT Goal Formulation: With patient Time For Goal Achievement: 10/01/14 Potential to Achieve Goals: Good    Frequency Min 3X/week   Barriers to discharge Inaccessible home environment;Decreased caregiver support      Co-evaluation               End of Session   Activity Tolerance: Treatment limited secondary to medical complications (Comment) Patient left: in bed;with bed alarm set;with family/visitor present (daughter using TV remote/call bell; plans to stay) Nurse Communication: Patient requests pain meds (elevated BP)         Time: 0981-1914: 0829-0850 PT Time Calculation (min) (ACUTE ONLY): 21 min   Charges:   PT Evaluation $Initial PT Evaluation Tier I: 1 Procedure     PT G Codes:        Margarie Mcguirt 09/24/2014, 9:08 AM Pager [phone]

## 2014-09-24 NOTE — Progress Notes (Signed)
VASCULAR LAB PRELIMINARY  PRELIMINARY  PRELIMINARY  PRELIMINARY  Carotid Dopplers completed.    Preliminary report:  1-39% ICA stenosis.  Vertebral artery flow is antegrade.   Pauleen Goleman, RVT 09/24/2014, 10:34 AM

## 2014-09-24 NOTE — Progress Notes (Signed)
Physical medicine rehabilitation consult requested with chart reviewed. Patient just admitted to the hospital 09/23/2014 a workup ongoing. Will await formal physical and occupational therapy evaluations to be completed and follow-up with appropriate recommendations.

## 2014-09-24 NOTE — Evaluation (Signed)
Occupational Therapy Evaluation Patient Details Name: [name] MRN: [id number] DOB: 1921/05/10 Today's Date: 09/24/2014    History of Present Illness Adm 09/23/14 with Lt sided weakness and difficulty speaking. MRI + small Rt MCA distribution infarcts (frontal, external capsule, insula). Also enlarged pituatary gland. BPs running >200 despite IV meds. PMHx- HTN, afib (not anticoagulated due to severe epistaxis), Rt THA   Clinical Impression   Patient independent PTA. Patient currently requires up to +2 for functional tasks, functional mobility, and ADLs. Patient will benefit from acute OT to increase overall independence in the areas of ADLs, functional mobility, LUE HEP, and overall safety in order to safely discharge to venue listed below.     Follow Up Recommendations  SNF;Supervision/Assistance - 24 hour    Equipment Recommendations   (TBD)    Recommendations for Other Services  None at this time     Precautions / Restrictions Precautions Precautions: Fall Precaution Comments: pt reports fell on Rt shoulder "when this happened" +bruise Restrictions Weight Bearing Restrictions: No      Mobility Bed Mobility Overal bed mobility: Needs Assistance Bed Mobility: Rolling;Sidelying to Sit Rolling: Mod assist Sidelying to sit: Max assist    Balance Overall balance assessment: Needs assistance Sitting-balance support: Feet supported;No upper extremity supported Sitting balance-Leahy Scale: Poor    ADL Overall ADL's : Needs assistance/impaired Eating/Feeding: Moderate assistance;Sitting   Grooming: Moderate assistance;Sitting   Upper Body Bathing: Moderate assistance;Sitting   Lower Body Bathing: Total assistance;Bed level   Upper Body Dressing : Moderate assistance;Sitting   Lower Body Dressing: Total assistance;Bed level                 General ADL Comments: Patient required mod assist to maintain sitting balance EOB. Patient with right lateral lean and  poor sitting posture. Patient with left inattention and required multiple cues to look towards her left. No transfer attempted with patient, believe patient will be a +2 for transfers     Vision Eye Alignment: Within Functional Limits Alignment/Gaze Preference: Head turned;Gaze right Ocular Range of Motion: Impaired-to be further tested in functional context Tracking/Visual Pursuits: Decreased smoothness of eye movement to LEFT superior field;Decreased smoothness of eye movement to LEFT inferior field;Requires cues, head turns, or add eye shifts to track    Perception Perception Perception Tested?: No   Praxis Praxis Praxis tested?: Not tested    Pertinent Vitals/Pain Pain Assessment: Faces Faces Pain Scale: Hurts a little bit Pain Location: head Pain Intervention(s): Monitored during session     Hand Dominance Right   Extremity/Trunk Assessment Upper Extremity Assessment Upper Extremity Assessment: LUE deficits/detail LUE Deficits / Details: limited movement > LUE, patient reports sensation is "ok" LUE Sensation: decreased proprioception LUE Coordination: decreased fine motor;decreased gross motor   Lower Extremity Assessment Lower Extremity Assessment: Defer to PT evaluation   Cervical / Trunk Assessment Cervical / Trunk Assessment: Kyphotic   Communication Communication Communication: HOH   Cognition Arousal/Alertness: Awake/alert Behavior During Therapy: WFL for tasks assessed/performed Overall Cognitive Status: Impaired/Different from baseline Area of Impairment: Awareness;Problem solving;Safety/judgement;Attention   Current Attention Level: Sustained     Safety/Judgement: Decreased awareness of safety;Decreased awareness of deficits Awareness: Emergent Problem Solving: Decreased initiation;Difficulty sequencing;Requires verbal cues                Home Living Family/patient expects to be discharged to:: Private residence Living Arrangements:  Alone Available Help at Discharge: Family;Available 24 hours/day (Daughter who lives across the street) Type of Home: House Home Access: Stairs to enter  Entrance Stairs-Number of Steps: 3 Entrance Stairs-Rails: Right Home Layout: One level     Bathroom Shower/Tub: Tub/shower unit Shower/tub characteristics: Curtain FirefighterBathroom Toilet: Standard     Home Equipment: Environmental consultantWalker - 4 wheels;Cane - single point;Bedside commode;Shower seat          Prior Functioning/Environment Level of Independence: Needs assistance  Gait / Transfers Assistance Needed: assist on stairs; used rollator all the time          OT Diagnosis: Generalized weakness;Hemiplegia non-dominant side;Acute pain   OT Problem List: Decreased strength;Decreased range of motion;Decreased activity tolerance;Impaired balance (sitting and/or standing);Impaired vision/perception;Decreased safety awareness;Decreased knowledge of use of DME or AE;Impaired sensation;Impaired UE functional use;Pain;Impaired tone;Decreased knowledge of precautions;Decreased cognition;Decreased coordination   OT Treatment/Interventions: Self-care/ADL training;Therapeutic exercise;Neuromuscular education;Patient/family education;Energy conservation;DME and/or AE instruction;Balance training;Visual/perceptual remediation/compensation;Therapeutic activities;Cognitive remediation/compensation    OT Goals(Current goals can be found in the care plan section) Acute Rehab OT Goals OT Goal Formulation: With patient Time For Goal Achievement: 10/08/14 Potential to Achieve Goals: Good ADL Goals Pt Will Perform Eating: with supervision;sitting;with adaptive utensils Pt Will Perform Grooming: with supervision;with adaptive equipment;sitting Pt Will Perform Upper Body Bathing: with supervision;sitting Pt Will Perform Lower Body Bathing: with mod assist;sit to/from stand;with adaptive equipment Pt Will Perform Upper Body Dressing: with min assist;sitting Pt Will  Perform Lower Body Dressing: with mod assist;with adaptive equipment;sit to/from stand Pt Will Transfer to Toilet: with min assist;bedside commode Pt/caregiver will Perform Home Exercise Program: Left upper extremity;With written HEP provided;With Supervision  OT Frequency: Min 3X/week   Barriers to D/C: Decreased caregiver support          End of Session Activity Tolerance: Patient tolerated treatment well Patient left: in bed;with call bell/phone within reach;with bed alarm set;with family/visitor present   Time: [id number] OT Time Calculation (min): 23 min Charges:  OT General Charges $OT Visit: 1 Procedure OT Evaluation $Initial OT Evaluation Tier I: 1 Procedure OT Treatments $Therapeutic Activity: 8-22 mins  Pricilla Moehle , MS, OTR/L, CLT Pager: [phone]  09/24/2014, 4:00 PM

## 2014-09-24 NOTE — Progress Notes (Signed)
  Echocardiogram 2D Echocardiogram has been performed.  [name] 09/24/2014, 11:40 AM

## 2014-09-24 NOTE — Procedures (Signed)
Objective Swallowing Evaluation: Modified Barium Swallowing Study  Patient Details  Name: [name] MRN: [id number] Date of Birth: Dec 21, 1920  Today's Date: 09/24/2014 Time: SLP Start Time (ACUTE ONLY): 0925-SLP Stop Time (ACUTE ONLY): 0940 SLP Time Calculation (min) (ACUTE ONLY): 15 min  Past Medical History:  Past Medical History  Diagnosis Date  . Atrial fibrillation   . Hypertension   . Thyroid disease     Hypothyroidism   Past Surgical History:  Past Surgical History  Procedure Laterality Date  . Joint replacement      Hip replacement   HPI:  HPI: Catherine BiggerMyrtle V Romain is a 81 y.o. female with a Past Medical History of atrial fibrillation not on anticoagulation (due to significant epistaxis), hypertension, hypothyroidism who presents today with the above noted complaint. Per patient, she was in her usual state of health, she went to bed last evening without any major issues. Around 4 AM this morning, she sustained a fall while attempting to get out of bed and could not get up. She was subsequently brought to the emergency room, which was found to have left-sided weakness, and left facial droop along with dysarthria. CT scan of the head showed a possible acute infarction involving the posterior right internal capsule.  No Data Recorded  Assessment / Plan / Recommendation CHL IP CLINICAL IMPRESSIONS 09/24/2014  Dysphagia Diagnosis Moderate oral phase dysphagia;Moderate pharyngeal phase dysphagia;Severe pharyngeal phase dysphagia  Clinical impression Pt with oral/pharyngeal dysphagia c/b issues with oral manipulation and control as well as delayed swallow trigger and mild pharyngeal weakness.  Pt with overt aspiration of thin liquids.  Chin tuck was attempted on nectar thick liquids and it made the aspiration worse.  Pt should have supervision for intake due to oral residuals that she is not always aware of.  I Rx Dysphagia 1 diet and honey thick liquids.  ST to follow for therapy and  therapeutic diet tolerance.        CHL IP TREATMENT RECOMMENDATION 09/24/2014  Treatment Plan Recommendations Therapy as outlined in treatment plan below     CHL IP DIET RECOMMENDATION 09/24/2014  Diet Recommendations Dysphagia 1 (Puree);Honey-thick liquid  Liquid Administration via Cup  Medication Administration Crushed with puree  Compensations Slow rate;Small sips/bites;Check for pocketing;Check for anterior loss  Postural Changes and/or Swallow Maneuvers Seated upright 90 degrees;Upright 30-60 min after meal     CHL IP OTHER RECOMMENDATIONS 09/24/2014  Recommended Consults (None)  Oral Care Recommendations Oral care Q4 per protocol  Other Recommendations Order thickener from pharmacy;Prohibited food (jello, ice cream, thin soups);Remove water pitcher;Have oral suction available     CHL IP FOLLOW UP RECOMMENDATIONS 09/23/2014  Follow up Recommendations Inpatient Rehab     CHL IP FREQUENCY AND DURATION 09/24/2014  Speech Therapy Frequency (ACUTE ONLY) min 2x/week  Treatment Duration 2 weeks     Pertinent Vitals/Pain     SLP Swallow Goals No flowsheet data found.  No flowsheet data found.    CHL IP REASON FOR REFERRAL 09/24/2014  Reason for Referral Objectively evaluate swallowing function     CHL IP ORAL PHASE 09/24/2014  Lips (None)  Tongue (None)  Mucous membranes (None)  Nutritional status (None)  Other (None)  Oxygen therapy (None)  Oral Phase Impaired  Oral - Pudding Teaspoon (None)  Oral - Pudding Cup (None)  Oral - Honey Teaspoon (None)  Oral - Honey Cup Left anterior bolus loss;Weak lingual manipulation;Left pocketing in lateral sulci;Piecemeal swallowing;Delayed oral transit  Oral - Honey Syringe (None)  Oral -  Nectar Teaspoon Left anterior bolus loss;Weak lingual manipulation;Left pocketing in lateral sulci;Delayed oral transit  Oral - Nectar Cup (None)  Oral - Nectar Straw (None)  Oral - Nectar Syringe (None)  Oral - Ice Chips (None)  Oral - Thin Teaspoon Left  anterior bolus loss;Weak lingual manipulation;Left pocketing in lateral sulci  Oral - Thin Cup (None)  Oral - Thin Straw (None)  Oral - Thin Syringe (None)  Oral - Puree Weak lingual manipulation;Left pocketing in lateral sulci  Oral - Mechanical Soft (None)  Oral - Regular (None)  Oral - Multi-consistency (None)  Oral - Pill (None)  Oral Phase - Comment Pt lingual weakness that leads to anterior escape primarily on the left of all tested textures except purees.        CHL IP PHARYNGEAL PHASE 09/24/2014  Pharyngeal Phase Impaired  Pharyngeal - Pudding Teaspoon (None)  Penetration/Aspiration details (pudding teaspoon) (None)  Pharyngeal - Pudding Cup (None)  Penetration/Aspiration details (pudding cup) (None)  Pharyngeal - Honey Teaspoon Delayed swallow initiation;Premature spillage to valleculae;Pharyngeal residue - valleculae  Penetration/Aspiration details (honey teaspoon) (None)  Pharyngeal - Honey Cup Premature spillage to valleculae;Delayed swallow initiation;Pharyngeal residue - valleculae  Penetration/Aspiration details (honey cup) (None)  Pharyngeal - Honey Syringe (None)  Penetration/Aspiration details (honey syringe) (None)  Pharyngeal - Nectar Teaspoon Delayed swallow initiation;Premature spillage to valleculae;Penetration/Aspiration during swallow  Penetration/Aspiration details (nectar teaspoon) Material enters airway, remains ABOVE vocal cords then ejected out  Pharyngeal - Nectar Cup (None)  Penetration/Aspiration details (nectar cup) (None)  Pharyngeal - Nectar Straw (None)  Penetration/Aspiration details (nectar straw) (None)  Pharyngeal - Nectar Syringe (None)  Penetration/Aspiration details (nectar syringe) (None)  Pharyngeal - Ice Chips (None)  Penetration/Aspiration details (ice chips) (None)  Pharyngeal - Thin Teaspoon Delayed swallow initiation;Premature spillage to pyriform sinuses;Pharyngeal residue - valleculae;Penetration/Aspiration before swallow   Penetration/Aspiration details (thin teaspoon) Material enters airway, passes BELOW cords without attempt by patient to eject out (silent aspiration)  Pharyngeal - Thin Cup (None)  Penetration/Aspiration details (thin cup) (None)  Pharyngeal - Thin Straw (None)  Penetration/Aspiration details (thin straw) (None)  Pharyngeal - Thin Syringe (None)  Penetration/Aspiration details (thin syringe') (None)  Pharyngeal - Puree Premature spillage to valleculae;Pharyngeal residue - valleculae  Penetration/Aspiration details (puree) (None)  Pharyngeal - Mechanical Soft (None)  Penetration/Aspiration details (mechanical soft) (None)  Pharyngeal - Regular (None)  Penetration/Aspiration details (regular) (None)  Pharyngeal - Multi-consistency (None)  Penetration/Aspiration details (multi-consistency) (None)  Pharyngeal - Pill (None)  Penetration/Aspiration details (pill) (None)  Pharyngeal Comment Pt with mild weakness noted that led to mild pharyngeal wall residue and vallecula residue.       CHL IP CERVICAL ESOPHAGEAL PHASE 09/24/2014  Cervical Esophageal Phase Impaired  Pudding Teaspoon (None)  Pudding Cup (None)  Honey Teaspoon (None)  Honey Cup (No Data)  Honey Syringe (None)  Nectar Teaspoon (None)  Nectar Cup (None)  Nectar Straw (None)  Nectar Syringe (None)  Thin Teaspoon (None)  Thin Cup (None)  Thin Straw (None)  Thin Syringe (None)  Cervical Esophageal Comment (None)    No flowsheet data found.        Dimas Aguas, MA, CCC-SLP Acute Rehab SLP [phone]  Fleet Contras 09/24/2014, 12:30 PM

## 2014-09-24 NOTE — Progress Notes (Signed)
BP has been elevated for most of the night even with PRN hydralazine. Last dose of hydralazine 10 mg was given at 0236, but BP rose back up to 220/73 at 0611. Hydralazine is Q6 and it was too early to give another dose. On call MD was paged for another dose of 10mg  to be given. Manual BP was done 20 minutes later to get 205/76. MD was paged again. A one time dose of 2mg  hydralazine was then ordered. This information was passed on to oncoming shift and BP will be continued to be monitored.

## 2014-09-25 DIAGNOSIS — N179 Acute kidney failure, unspecified: Secondary | ICD-10-CM

## 2014-09-25 DIAGNOSIS — R1314 Dysphagia, pharyngoesophageal phase: Secondary | ICD-10-CM

## 2014-09-25 DIAGNOSIS — I10 Essential (primary) hypertension: Secondary | ICD-10-CM

## 2014-09-25 DIAGNOSIS — R0602 Shortness of breath: Secondary | ICD-10-CM

## 2014-09-25 DIAGNOSIS — I639 Cerebral infarction, unspecified: Secondary | ICD-10-CM

## 2014-09-25 LAB — GLUCOSE, CAPILLARY
Glucose-Capillary: 102 mg/dL — ABNORMAL HIGH (ref 70–99)
Glucose-Capillary: 117 mg/dL — ABNORMAL HIGH (ref 70–99)
Glucose-Capillary: 130 mg/dL — ABNORMAL HIGH (ref 70–99)
Glucose-Capillary: 119 mg/dL — ABNORMAL HIGH (ref 70–99)
Glucose-Capillary: 122 mg/dL — ABNORMAL HIGH (ref 70–99)

## 2014-09-25 LAB — HEMOGLOBIN A1C
Hgb A1c MFr Bld: 5.7 % — ABNORMAL HIGH (ref 4.8–5.6)
MEAN PLASMA GLUCOSE: 117 mg/dL

## 2014-09-25 MED ORDER — APIXABAN 2.5 MG PO TABS
2.5000 mg | ORAL_TABLET | Freq: Two times a day (BID) | ORAL | Status: DC
  Administered 2014-09-25 – 2014-09-26 (×2): 2.5 mg via ORAL
  Filled 2014-09-25 (×2): qty 1

## 2014-09-25 MED ORDER — AMLODIPINE BESYLATE 2.5 MG PO TABS
2.5000 mg | ORAL_TABLET | Freq: Once | ORAL | Status: AC
  Administered 2014-09-25: 2.5 mg via ORAL
  Filled 2014-09-25: qty 1

## 2014-09-25 MED ORDER — HYDRALAZINE HCL 25 MG PO TABS: 25.0000 mg | ORAL_TABLET | Freq: Three times a day (TID) | ORAL | Status: DC

## 2014-09-25 MED ORDER — ATORVASTATIN CALCIUM 10 MG PO TABS
20.0000 mg | ORAL_TABLET | Freq: Every day | ORAL | Status: DC
  Administered 2014-09-25 – 2014-09-26 (×2): 20 mg via ORAL
  Filled 2014-09-25 (×3): qty 2

## 2014-09-25 MED ORDER — HYDRALAZINE HCL 25 MG PO TABS
25.0000 mg | ORAL_TABLET | Freq: Three times a day (TID) | ORAL | Status: DC
  Administered 2014-09-25 – 2014-09-26 (×3): 25 mg via ORAL
  Filled 2014-09-25 (×2): qty 1

## 2014-09-25 MED ORDER — AMLODIPINE BESYLATE 5 MG PO TABS: 5.0000 mg | ORAL_TABLET | Freq: Every day | ORAL | Status: DC

## 2014-09-25 NOTE — Clinical Social Work Note (Signed)
Clinical Social Worker has assessed patient and pt's family. Full psychosocial assessment to follow.   Derenda FennelBashira Jakub Debold Kolton, MSW, LCSWA [phone] 09/25/2014 3:11 PM

## 2014-09-25 NOTE — Consult Note (Signed)
Physical Medicine and Rehabilitation Consult Reason for Consult: Right MCA infarct Referring Physician: [facility]   HPI: [name] is a 81 y.o. right handed female with history of atrial fibrillation not on anticoagulation due to significant history of epistaxis, hypertension. Admitted 09/23/2014 with left-sided weakness, facial droop and dysarthria. Patient lives alone using a rollator at all times to aid in mobility. CT of the head showed questionable small lacunar infarct posterior limb right internal capsule. She did not receive TPA. MRI/MRA of the brain shows small areas of acute infarction in the right middle cerebral artery territory as well as occlusion of the right M2 inferior division. Echocardiogram with ejection fraction of 65% grade 3 diastolic dysfunction. Carotid Dopplers and no ICA stenosis. Neurology consulted maintained on aspirin for CVA prophylaxis as well as subcutaneous heparin for DVT prophylaxis. Blood pressures have been increased variable and monitor. Modified barium swallow 09/24/2014 currently maintained on a dysphagia 1 honey thick liquid diet. Physical and occupational therapy evaluations completed. M.D. has requested physical medicine rehabilitation consult.   Review of Systems  Cardiovascular: Positive for palpitations.  Gastrointestinal: Positive for constipation.  Musculoskeletal: Positive for myalgias and joint pain.  All other systems reviewed and are negative.  Past Medical History  Diagnosis Date  . Atrial fibrillation   . Hypertension   . Thyroid disease     Hypothyroidism  . Arthritis    Past Surgical History  Procedure Laterality Date  . Joint replacement      Hip replacement   History reviewed. No pertinent family history. Social History:  reports that she has never smoked. She does not have any smokeless tobacco history on file. She reports that she does not drink alcohol or use illicit drugs. Allergies:  Allergies  Allergen  Reactions  . Penicillins Rash   Medications Prior to Admission  Medication Sig Dispense Refill  . aspirin 81 MG tablet Take 81 mg by mouth daily.    . digoxin (LANOXIN) 0.125 MG tablet Take 0.125 mg by mouth daily.    Marland Kitchen levothyroxine (SYNTHROID, LEVOTHROID) 50 MCG tablet Take 50 mcg by mouth daily before breakfast.    . losartan (COZAAR) 100 MG tablet Take 100 mg by mouth daily.    Marland Kitchen oxyCODONE-acetaminophen (PERCOCET/ROXICET) 5-325 MG per tablet Take 1 tablet by mouth every 8 (eight) hours as needed for moderate pain or severe pain.    Marland Kitchen triamterene-hydrochlorothiazide (MAXZIDE) 75-50 MG per tablet Take 1 tablet by mouth daily.    Marland Kitchen DIGOXIN PO Take by mouth.    Marland Kitchen LEVOTHYROXINE SODIUM PO Take by mouth.    . TRAMADOL HCL PO Take by mouth.    . triamterene-hydrochlorothiazide (DYAZIDE) 50-25 MG per capsule Take 1 capsule by mouth every morning.    . triamterene-hydrochlorothiazide (MAXZIDE-25) 37.5-25 MG per tablet Take 1 tablet by mouth daily.      Home: Home Living Family/patient expects to be discharged to:: Private residence Living Arrangements: Alone Available Help at Discharge: Family, Available 24 hours/day (Daughter who lives across the street) Type of Home: House Home Access: Stairs to enter Secretary/administrator of Steps: 3 Entrance Stairs-Rails: Right Home Layout: One level Home Equipment: Environmental consultant - 4 wheels, [employer] - single point, Bedside commode, Shower seat  Functional History: Prior Function Level of Independence: Needs assistance Gait / Transfers Assistance Needed: assist on stairs; used rollator all the time Functional Status:  Mobility: Bed Mobility Overal bed mobility: Needs Assistance Bed Mobility: Rolling, Sidelying to Sit Rolling: Mod assist Sidelying to  sit: Max assist General bed mobility comments: Lt- supervision with rail; Rt- mod assist Transfers General transfer comment: unable to assess      ADL: ADL Overall ADL's : Needs  assistance/impaired Eating/Feeding: Moderate assistance, Sitting Grooming: Moderate assistance, Sitting Upper Body Bathing: Moderate assistance, Sitting Lower Body Bathing: Total assistance, Bed level Upper Body Dressing : Moderate assistance, Sitting Lower Body Dressing: Total assistance, Bed level General ADL Comments: Patient required mod assist to maintain sitting balance EOB. Patient with right lateral lean and poor sitting posture. Patient with left inattention and required multiple cues to look towards her left. No transfer attempted with patient, believe patient will be a +2 for transfers  Cognition: Cognition Overall Cognitive Status: Impaired/Different from baseline Arousal/Alertness: Awake/alert Orientation Level: Oriented X4 Attention: Sustained, Selective Sustained Attention: Appears intact Selective Attention: Appears intact Memory: Impaired Memory Impairment: Decreased recall of new information Awareness: Impaired Awareness Impairment: Intellectual impairment, Emergent impairment Cognition Arousal/Alertness: Awake/alert Behavior During Therapy: WFL for tasks assessed/performed Overall Cognitive Status: Impaired/Different from baseline Area of Impairment: Awareness, Problem solving, Safety/judgement, Attention Current Attention Level: Sustained Safety/Judgement: Decreased awareness of safety, Decreased awareness of deficits Awareness: Emergent Problem Solving: Decreased initiation, Difficulty sequencing, Requires verbal cues  Blood pressure 187/75, pulse 63, temperature 97.6 F (36.4 C), temperature source Axillary, resp. rate 20, height 5\' 3"  (1.6 m), weight 74.844 kg (165 lb), SpO2 95 %. Physical Exam  HENT:  Left facial droop  Eyes:  Pupils reactive to light  Neck: Normal range of motion. Neck supple. No thyromegaly present.  Cardiovascular:  Cardiac rate controlled  Respiratory: Breath sounds normal. She is in respiratory distress.  GI: Soft. Bowel sounds  are normal. She exhibits no distension.  Neurological:  Patient is alert and makes good eye contact with examiner. She does appear to have some left-sided neglect. She was able to provide her age as well as date of birth. Limited awareness of her deficits. She did follow simple commands. Dense left hemiparesis.  Skin: Skin is warm and dry.  Psychiatric:  Flat, sometimes anxious    Results for orders placed or performed during the hospital encounter of 09/23/14 (from the past 24 hour(s))  Glucose, capillary     Status: Abnormal   Collection Time: 09/24/14  6:57 AM  Result Value Ref Range   Glucose-Capillary 108 (H) 70 - 99 mg/dL   Comment 1 Documented in Chart    Comment 2 Notify RN   Lipid panel     Status: Abnormal   Collection Time: 09/24/14  7:51 AM  Result Value Ref Range   Cholesterol 141 0 - 200 mg/dL   Triglycerides 97 <161<150 mg/dL   HDL 38 (L) >09>39 mg/dL   Total CHOL/HDL Ratio 3.7 RATIO   VLDL 19 0 - 40 mg/dL   LDL Cholesterol 84 0 - 99 mg/dL  Basic metabolic panel     Status: Abnormal   Collection Time: 09/24/14  7:51 AM  Result Value Ref Range   Sodium 143 135 - 145 mmol/L   Potassium 3.2 (L) 3.5 - 5.1 mmol/L   Chloride 108 96 - 112 mmol/L   CO2 24 19 - 32 mmol/L   Glucose, Bld 118 (H) 70 - 99 mg/dL   BUN 20 6 - 23 mg/dL   Creatinine, Ser 6.041.38 (H) 0.50 - 1.10 mg/dL   Calcium 8.9 8.4 - 54.010.5 mg/dL   GFR calc non Af Amer 32 (L) >90 mL/min   GFR calc Af Amer 37 (L) >90 mL/min   Anion  gap 11 5 - 15   Dg Shoulder Right  09/23/2014   CLINICAL DATA:  Right shoulder pain after fall out of bed.  EXAM: RIGHT SHOULDER - 2+ VIEW  COMPARISON:  None.  FINDINGS: No fracture or dislocation. Transscapular Y-view slightly limited due to soft tissue attenuation. Mild proliferative change at the acromioclavicular joint.  IMPRESSION: No fracture or dislocation of the right shoulder.   Electronically Signed   By: Rubye Oaks M.D.   On: 09/23/2014 06:32   Mr Brain Wo Contrast  09/23/2014    CLINICAL DATA:  Left-sided weakness and left facial droop with dysarthria. Stroke.  EXAM: MRI HEAD WITHOUT CONTRAST  MRA HEAD WITHOUT CONTRAST  TECHNIQUE: Multiplanar, multiecho pulse sequences of the brain and surrounding structures were obtained without intravenous contrast. Angiographic images of the head were obtained using MRA technique without contrast.  COMPARISON:  Head CT 09/23/2014  FINDINGS: MRI HEAD FINDINGS  The pituitary gland is prominent in size and appears asymmetrically enlarged on the right with a convex superior margin and heterogeneity suggestive of an underlying lesion. The right aspect of the gland measures 10 mm in craniocaudal height. There is no evidence of suprasellar extension. There are small regions of restricted diffusion in the right MCA territory involving right frontal cortex and white matter, external capsule, insula, and temporal operculum. Small foci of chronic microhemorrhage are noted in the left frontal lobe and right parietal lobe. Mild generalized cerebral atrophy is within normal limits for age. There is no evidence of mass, midline shift, or extra-axial fluid collection. Small foci of T2 hyperintensity in the cerebral white matter bilaterally are nonspecific but compatible with mild chronic small vessel ischemic disease.  Prior bilateral cataract extraction is noted. Paranasal sinuses are clear. There is trace right mastoid air cell fluid. Abnormal right MCA branch vessel flow voids are more fully evaluated below.  MRA HEAD FINDINGS  The visualized distal vertebral arteries are patent with the right being slightly dominant. PICA and SCA origins are patent. Basilar artery is patent without stenosis. There is a fetal type origin of the right PCA. PCAs are otherwise unremarkable. Both the  Internal carotid arteries are patent from skullbase to carotid termini. Right M1 segment is patent without stenosis. There is occlusion of the right M2 inferior division trunk just beyond  its origin. The superior division appears patent. ACAs and left MCA are unremarkable. No intracranial aneurysm is identified.  IMPRESSION: 1. Small areas of acute infarction in the right MCA territory. 2. Occlusion of the right M2 inferior division. 3. Asymmetric, mild enlargement of the pituitary gland on the right suggestive of an underlying lesion, such as a microadenoma. If clinically warranted, elective pituitary protocol brain MRI could be performed for further evaluation.   Electronically Signed   By: Sebastian Ache   On: 09/23/2014 11:11   Dg Knee Complete 4 Views Right  09/23/2014   CLINICAL DATA:  Right knee pain after fall.  EXAM: RIGHT KNEE - COMPLETE 4+ VIEW  COMPARISON:  None.  FINDINGS: Two partially cannulated screws traverse the proximal tibia, no periprosthetic lucency or fracture. No fracture or dislocation. The alignment is maintained. There is mild osteoarthritis. No joint effusion.  IMPRESSION: Mild osteoarthritis.  No fracture or dislocation.   Electronically Signed   By: Rubye Oaks M.D.   On: 09/23/2014 06:31   Mr Maxine Glenn Head/brain Wo Cm  09/23/2014   CLINICAL DATA:  Left-sided weakness and left facial droop with dysarthria. Stroke.  EXAM: MRI HEAD WITHOUT  CONTRAST  MRA HEAD WITHOUT CONTRAST  TECHNIQUE: Multiplanar, multiecho pulse sequences of the brain and surrounding structures were obtained without intravenous contrast. Angiographic images of the head were obtained using MRA technique without contrast.  COMPARISON:  Head CT 09/23/2014  FINDINGS: MRI HEAD FINDINGS  The pituitary gland is prominent in size and appears asymmetrically enlarged on the right with a convex superior margin and heterogeneity suggestive of an underlying lesion. The right aspect of the gland measures 10 mm in craniocaudal height. There is no evidence of suprasellar extension. There are small regions of restricted diffusion in the right MCA territory involving right frontal cortex and white matter, external  capsule, insula, and temporal operculum. Small foci of chronic microhemorrhage are noted in the left frontal lobe and right parietal lobe. Mild generalized cerebral atrophy is within normal limits for age. There is no evidence of mass, midline shift, or extra-axial fluid collection. Small foci of T2 hyperintensity in the cerebral white matter bilaterally are nonspecific but compatible with mild chronic small vessel ischemic disease.  Prior bilateral cataract extraction is noted. Paranasal sinuses are clear. There is trace right mastoid air cell fluid. Abnormal right MCA branch vessel flow voids are more fully evaluated below.  MRA HEAD FINDINGS  The visualized distal vertebral arteries are patent with the right being slightly dominant. PICA and SCA origins are patent. Basilar artery is patent without stenosis. There is a fetal type origin of the right PCA. PCAs are otherwise unremarkable. Both the  Internal carotid arteries are patent from skullbase to carotid termini. Right M1 segment is patent without stenosis. There is occlusion of the right M2 inferior division trunk just beyond its origin. The superior division appears patent. ACAs and left MCA are unremarkable. No intracranial aneurysm is identified.  IMPRESSION: 1. Small areas of acute infarction in the right MCA territory. 2. Occlusion of the right M2 inferior division. 3. Asymmetric, mild enlargement of the pituitary gland on the right suggestive of an underlying lesion, such as a microadenoma. If clinically warranted, elective pituitary protocol brain MRI could be performed for further evaluation.   Electronically Signed   By: Sebastian Ache   On: 09/23/2014 11:11    Assessment/Plan: Diagnosis: Right MCA infarct 1. Does the need for close, 24 hr/day medical supervision in concert with the patient's rehab needs make it unreasonable for this patient to be served in a less intensive setting? Yes 2. Co-Morbidities requiring supervision/potential  complications: htn, dsphagia, afib 3. Due to bladder management, bowel management, safety, skin/wound care, disease management, medication administration, pain management and patient education, does the patient require 24 hr/day rehab nursing? Yes 4. Does the patient require coordinated care of a physician, rehab nurse, PT (1-2 hrs/day, 5 days/week), OT (1-2 hrs/day, 5 days/week) and SLP (\1-2 hrs/day, 5 days/week) to address physical and functional deficits in the context of the above medical diagnosis(es)? Yes Addressing deficits in the following areas: balance, endurance, locomotion, strength, transferring, bowel/bladder control, bathing, dressing, feeding, grooming, toileting, cognition, speech, language, swallowing and psychosocial support 5. Can the patient actively participate in an intensive therapy program of at least 3 hrs of therapy per day at least 5 days per week? Yes 6. The potential for patient to make measurable gains while on inpatient rehab is good 7. Anticipated functional outcomes upon discharge from inpatient rehab are min assist and mod assist  with PT, min assist and mod assist with OT, supervision with SLP. 8. Estimated rehab length of stay to reach the above functional goals  is: 20-27 days 9. Does the patient have adequate social supports and living environment to accommodate these discharge functional goals? Yes 10. Anticipated D/C setting: Home 11. Anticipated post D/C treatments: HH therapy and Outpatient therapy 12. Overall Rehab/Functional Prognosis: good  RECOMMENDATIONS: This patient's condition is appropriate for continued rehabilitative care in the following setting: CIR Patient has agreed to participate in recommended program. N/A Note that insurance prior authorization may be required for reimbursement for recommended care.  Comment: Spoke with family in the room who is working on follow up, d/c planning. Pt currently was having SOB while I was in the  room---recommend EKG, VS, oxygen sat check.  Ranelle Oyster, MD, [facility] Physical Medicine & Rehabilitation 09/25/2014     09/25/2014

## 2014-09-25 NOTE — Progress Notes (Signed)
ANTICOAGULATION CONSULT NOTE - Initial Consult  Pharmacy Consult for Apixaban Indication: atrial fibrillation  Allergies  Allergen Reactions  . Penicillins Rash    Patient Measurements: Height: 5\' 3"  (160 cm) Weight: 165 lb (74.844 kg) IBW/kg (Calculated) : 52.4  Vital Signs: Temp: 99 F (37.2 C) (02/09 1507) Temp Source: Axillary (02/09 1507) BP: 172/61 mmHg (02/09 1507) Pulse Rate: 76 (02/09 1507)  Labs:  Recent Labs  09/23/14 0511 09/23/14 0519 09/24/14 0751  HGB 12.5 13.6  --   HCT 38.8 40.0  --   PLT 160  --   --   APTT 32  --   --   LABPROT 14.0  --   --   INR 1.07  --   --   CREATININE 1.57* 1.50* 1.38*    Estimated Creatinine Clearance: 25.7 mL/min (by C-G formula based on Cr of 1.38).   Medical History: Past Medical History  Diagnosis Date  . Atrial fibrillation   . Hypertension   . Thyroid disease     Hypothyroidism  . Arthritis     Medications:  Prescriptions prior to admission  Medication Sig Dispense Refill Last Dose  . aspirin 81 MG tablet Take 81 mg by mouth daily.   09/22/2014 at Unknown time  . digoxin (LANOXIN) 0.125 MG tablet Take 0.125 mg by mouth daily.   09/22/2014 at Unknown time  . levothyroxine (SYNTHROID, LEVOTHROID) 50 MCG tablet Take 50 mcg by mouth daily before breakfast.   09/22/2014 at Unknown time  . losartan (COZAAR) 100 MG tablet Take 100 mg by mouth daily.   09/22/2014 at Unknown time  . oxyCODONE-acetaminophen (PERCOCET/ROXICET) 5-325 MG per tablet Take 1 tablet by mouth every 8 (eight) hours as needed for moderate pain or severe pain.   Past Month at Unknown time  . triamterene-hydrochlorothiazide (MAXZIDE) 75-50 MG per tablet Take 1 tablet by mouth daily.   09/22/2014 at Unknown time  . DIGOXIN PO Take by mouth.     Marland Kitchen. LEVOTHYROXINE SODIUM PO Take by mouth.     . TRAMADOL HCL PO Take by mouth.     . triamterene-hydrochlorothiazide (DYAZIDE) 50-25 MG per capsule Take 1 capsule by mouth every morning.     .  triamterene-hydrochlorothiazide (MAXZIDE-25) 37.5-25 MG per tablet Take 1 tablet by mouth daily.      Scheduled:  . [START ON 09/26/2014] amLODipine  5 mg Oral Daily  . antiseptic oral rinse  7 mL Mouth Rinse q12n4p  . atorvastatin  20 mg Oral q1800  . chlorhexidine  15 mL Mouth Rinse BID  . digoxin  0.125 mg Oral Daily  . heparin  5,000 Units Subcutaneous 3 times per day  . hydrALAZINE  25 mg Oral 3 times per day  . levothyroxine  50 mcg Oral QAC breakfast   Infusions:    Assessment: 81yo female with history of Afib on anticoagulation previously presents with stroke. Pharmacy is consulted to dose apixaban for nonvalvular atrial fibrillation. Pt has refused coumadin and has bled on pradaxa in the past. With patient's age, history of bleed, and elevated sCr, will use reduced dosing.  Goal of Therapy:  Monitor platelets by anticoagulation protocol: Yes   Plan:  Apixaban 2.5mg  PO BID Continue to monitor H&H and platelets and renal function Educate pt on apixaban  Arlean Hoppingorey M. Newman PiesBall, PharmD Clinical Pharmacist Pager [phone]  09/25/2014,3:23 PM

## 2014-09-25 NOTE — Progress Notes (Signed)
Patient ID: [name]  female  GNF:[id number]    DOB: 09-14-1920    DOA: 09/23/2014  PCP: Lorenda PeckOBERTS, RONALD WAYNE, MD  Brief history of present illness Catherine BiggerMyrtle V Beckles is a 81 y.o. female with a Past Medical History of atrial fibrillation not on anticoagulation (due to significant epistaxis), hypertension, hypothyroidism who presents today with the above noted complaint. Per patient, she was in her usual state of health, she went to bed the night before the admission without any major issues. Around 4 AM on the morning of admission, she sustained a fall while attempting to get out of bed and could not get up. She was subsequently brought to the emergency room, which was found to have left-sided weakness, and left facial droop along with dysarthria. CT scan of the head showed a possible acute infarction involving the posterior right internal capsule. Neurology was consulted  Assessment/Plan: Principal Problem:   Acute CVA (cerebrovascular accident): Likely cardioembolic CVA given history of atrial fibrillation, left-sided hemiparesis, dysarthria and residual dysphagia - MRI of the brain showed small areas of acute infarction in the right MCA territory - MRA showed occlusion of right M2 inferior division, microadenoma of the pituitary gland -2-D echshowed EF of 60-65%, grade 3 diastolic dysfunction, moderately to severely dilated right atrium, moderate TR, pulmonary hypertension  -Carotid Dopplers showed 1-39% ICA stenosis   - Patient has significant dysarthria with dysphagia, residual from acute CVA but tolerated a dysphagia 1 diet  -  lipid panel showed LDL 84, goal <70placed on statins - Hemoglobin A1c 5.7  -  PTOT evaluation recommended skilled nursing facility   Active Problems:   Atrial fibrillation, unspecified - Continue digoxin - Patient has been on anticoagulation in the past, PRADAXA however per family, this was discontinued due to severe epistaxis. Her PCP had placed her on  aspirin. Will await further neurology recommendations, given her history of recurrent epistaxis, age 63may not be a candidate for long-term anticoagulation    Hypertension - placed on Norvasc and hydralazine, patient is on triamterene, HCTZ and lisinopril prior to admission, creatinine function is still slightly elevated hence hold off on these antihypertensives    ARF (acute renal failure) - Continue to hold  HCTZ, lisinopril and triamterene, DC IV fluids to avoid fluid overload, creatinine has been improving     Dysphagia, pharyngoesophageal phase - passed swallow testing, started on pured diet   DVT Prophylaxis:Heparin subcutaneous   Code Status:DO NOT RESUSCITATE   Family Communication:  Disposition:Patient lives alone, will definitely need rehabilitation/skilled nursing facility   Consultants:  Neurology   Procedures:  MRI of brain, carotid Dopplers    2-D echo  Antibiotics:  none    Subjective: Seen and examined, alert and awake, still has significant dysarthria and left-sided hemiparesis, tolerating pured diet   Objective Weight change:   Intake/Output Summary (Last 24 hours) at 09/25/14 1119 Last data filed at 09/25/14 1037  Gross per 24 hour  Intake    410 ml  Output      0 ml  Net    410 ml   Blood pressure 207/70, pulse 74, temperature 98.6 F (37 C), temperature source Oral, resp. rate 20, height 5\' 3"  (1.6 m), weight 74.844 kg (165 lb), SpO2 95 %.  Physical Exam: General: Alert and awake, oriented, dysarthria, left-sided facial droop  CVS: S1-S2 clear, no murmur rubs or gallops Chest: clear to auscultation bilaterally, no wheezing, rales or rhonchi Abdomen: soft nontender, nondistended, normal bowel sounds  Extremities: no  cyanosis, clubbing or edema noted bilaterally Neuro: left upper extremity 0/5,  left lower extremities 3/5, right UE, LE5/5, dysarthria, facial drooping   Lab Results: Basic Metabolic Panel:  Recent Labs Lab  09/23/14 0511 09/23/14 0519 09/24/14 0751  NA 139 140 143  K 3.6 3.6 3.2*  CL 105 105 108  CO2 22  --  24  GLUCOSE 114* 113* 118*  BUN 24* 28* 20  CREATININE 1.57* 1.50* 1.38*  CALCIUM 9.1  --  8.9   Liver Function Tests:  Recent Labs Lab 09/23/14 0511  AST 24  ALT 15  ALKPHOS 69  BILITOT 0.8  PROT 6.8  ALBUMIN 3.9   No results for input(s): LIPASE, AMYLASE in the last 168 hours. No results for input(s): AMMONIA in the last 168 hours. CBC:  Recent Labs Lab 09/23/14 0511 09/23/14 0519  WBC 11.3*  --   NEUTROABS 7.4  --   HGB 12.5 13.6  HCT 38.8 40.0  MCV 93.3  --   PLT 160  --    Cardiac Enzymes: No results for input(s): CKTOTAL, CKMB, CKMBINDEX, TROPONINI in the last 168 hours. BNP: Invalid input(s): POCBNP CBG:  Recent Labs Lab 09/23/14 0536 09/23/14 1639 09/23/14 2146 09/24/14 0657  GLUCAP 108* 97 109* 108*     Micro Results: No results found for this or any previous visit (from the past 240 hour(s)).  Studies/Results: Dg Shoulder Right  09/23/2014   CLINICAL DATA:  Right shoulder pain after fall out of bed.  EXAM: RIGHT SHOULDER - 2+ VIEW  COMPARISON:  None.  FINDINGS: No fracture or dislocation. Transscapular Y-view slightly limited due to soft tissue attenuation. Mild proliferative change at the acromioclavicular joint.  IMPRESSION: No fracture or dislocation of the right shoulder.   Electronically Signed   By: Rubye Oaks M.D.   On: 09/23/2014 06:32   Ct Head Wo Contrast  09/23/2014   CLINICAL DATA:  Code stroke.  Left facial droop.  EXAM: CT HEAD WITHOUT CONTRAST  TECHNIQUE: Contiguous axial images were obtained from the base of the skull through the vertex without intravenous contrast.  COMPARISON:  None.  FINDINGS: No intracranial hemorrhage. There is a questionable small focus of decreased density in the right posterior limb of the internal capsule that may reflect a small lacunar infarct versus volume averaging. There is no territorial  infarct. Mild periventricular white matter change. No hydrocephalus. The basilar cisterns are patent. No intracranial fluid collection. Calvarium is intact. Included paranasal sinuses and mastoid air cells are well aerated.  IMPRESSION: 1. Questionable small lacunar infarct posterior limb right internal capsule. 2. No intracranial hemorrhage. These results were called by telephone at the time of interpretation on 09/23/2014 at 5:33 am to Dr. Karma Ganja, who verbally acknowledged these results.   Electronically Signed   By: Rubye Oaks M.D.   On: 09/23/2014 05:34   Mr Brain Wo Contrast  09/23/2014   CLINICAL DATA:  Left-sided weakness and left facial droop with dysarthria. Stroke.  EXAM: MRI HEAD WITHOUT CONTRAST  MRA HEAD WITHOUT CONTRAST  TECHNIQUE: Multiplanar, multiecho pulse sequences of the brain and surrounding structures were obtained without intravenous contrast. Angiographic images of the head were obtained using MRA technique without contrast.  COMPARISON:  Head CT 09/23/2014  FINDINGS: MRI HEAD FINDINGS  The pituitary gland is prominent in size and appears asymmetrically enlarged on the right with a convex superior margin and heterogeneity suggestive of an underlying lesion. The right aspect of the gland measures 10 mm in craniocaudal height.  There is no evidence of suprasellar extension. There are small regions of restricted diffusion in the right MCA territory involving right frontal cortex and white matter, external capsule, insula, and temporal operculum. Small foci of chronic microhemorrhage are noted in the left frontal lobe and right parietal lobe. Mild generalized cerebral atrophy is within normal limits for age. There is no evidence of mass, midline shift, or extra-axial fluid collection. Small foci of T2 hyperintensity in the cerebral white matter bilaterally are nonspecific but compatible with mild chronic small vessel ischemic disease.  Prior bilateral cataract extraction is noted. Paranasal  sinuses are clear. There is trace right mastoid air cell fluid. Abnormal right MCA branch vessel flow voids are more fully evaluated below.  MRA HEAD FINDINGS  The visualized distal vertebral arteries are patent with the right being slightly dominant. PICA and SCA origins are patent. Basilar artery is patent without stenosis. There is a fetal type origin of the right PCA. PCAs are otherwise unremarkable. Both the  Internal carotid arteries are patent from skullbase to carotid termini. Right M1 segment is patent without stenosis. There is occlusion of the right M2 inferior division trunk just beyond its origin. The superior division appears patent. ACAs and left MCA are unremarkable. No intracranial aneurysm is identified.  IMPRESSION: 1. Small areas of acute infarction in the right MCA territory. 2. Occlusion of the right M2 inferior division. 3. Asymmetric, mild enlargement of the pituitary gland on the right suggestive of an underlying lesion, such as a microadenoma. If clinically warranted, elective pituitary protocol brain MRI could be performed for further evaluation.   Electronically Signed   By: Sebastian Ache   On: 09/23/2014 11:11   Dg Knee Complete 4 Views Right  09/23/2014   CLINICAL DATA:  Right knee pain after fall.  EXAM: RIGHT KNEE - COMPLETE 4+ VIEW  COMPARISON:  None.  FINDINGS: Two partially cannulated screws traverse the proximal tibia, no periprosthetic lucency or fracture. No fracture or dislocation. The alignment is maintained. There is mild osteoarthritis. No joint effusion.  IMPRESSION: Mild osteoarthritis.  No fracture or dislocation.   Electronically Signed   By: Rubye Oaks M.D.   On: 09/23/2014 06:31   Mr Maxine Glenn Head/brain Wo Cm  09/23/2014   CLINICAL DATA:  Left-sided weakness and left facial droop with dysarthria. Stroke.  EXAM: MRI HEAD WITHOUT CONTRAST  MRA HEAD WITHOUT CONTRAST  TECHNIQUE: Multiplanar, multiecho pulse sequences of the brain and surrounding structures were  obtained without intravenous contrast. Angiographic images of the head were obtained using MRA technique without contrast.  COMPARISON:  Head CT 09/23/2014  FINDINGS: MRI HEAD FINDINGS  The pituitary gland is prominent in size and appears asymmetrically enlarged on the right with a convex superior margin and heterogeneity suggestive of an underlying lesion. The right aspect of the gland measures 10 mm in craniocaudal height. There is no evidence of suprasellar extension. There are small regions of restricted diffusion in the right MCA territory involving right frontal cortex and white matter, external capsule, insula, and temporal operculum. Small foci of chronic microhemorrhage are noted in the left frontal lobe and right parietal lobe. Mild generalized cerebral atrophy is within normal limits for age. There is no evidence of mass, midline shift, or extra-axial fluid collection. Small foci of T2 hyperintensity in the cerebral white matter bilaterally are nonspecific but compatible with mild chronic small vessel ischemic disease.  Prior bilateral cataract extraction is noted. Paranasal sinuses are clear. There is trace right mastoid air cell fluid.  Abnormal right MCA branch vessel flow voids are more fully evaluated below.  MRA HEAD FINDINGS  The visualized distal vertebral arteries are patent with the right being slightly dominant. PICA and SCA origins are patent. Basilar artery is patent without stenosis. There is a fetal type origin of the right PCA. PCAs are otherwise unremarkable. Both the  Internal carotid arteries are patent from skullbase to carotid termini. Right M1 segment is patent without stenosis. There is occlusion of the right M2 inferior division trunk just beyond its origin. The superior division appears patent. ACAs and left MCA are unremarkable. No intracranial aneurysm is identified.  IMPRESSION: 1. Small areas of acute infarction in the right MCA territory. 2. Occlusion of the right M2 inferior  division. 3. Asymmetric, mild enlargement of the pituitary gland on the right suggestive of an underlying lesion, such as a microadenoma. If clinically warranted, elective pituitary protocol brain MRI could be performed for further evaluation.   Electronically Signed   By: Sebastian Ache   On: 09/23/2014 11:11    Medications: Scheduled Meds: . amLODipine  2.5 mg Oral Once  . [START ON 09/26/2014] amLODipine  5 mg Oral Daily  . antiseptic oral rinse  7 mL Mouth Rinse q12n4p  . aspirin  300 mg Rectal Daily   Or  . aspirin  325 mg Oral Daily  . chlorhexidine  15 mL Mouth Rinse BID  . digoxin  0.125 mg Oral Daily  . heparin  5,000 Units Subcutaneous 3 times per day  . hydrALAZINE  25 mg Oral 3 times per day  . levothyroxine  50 mcg Oral QAC breakfast    time spent 25 minutes    LOS: 2 days   RAI,RIPUDEEP M.D. [facility] 09/25/2014, 11:19 AM Pager: [phone]  If 7PM-7AM, please contact night-coverage [email or web address] Password [email or web address]

## 2014-09-25 NOTE — Progress Notes (Signed)
Speech Language Pathology Treatment: Dysphagia;Cognitive-Linquistic  Patient Details Name: [name] MRN: [id number] DOB: October 25, 1920 Today's Date: 09/25/2014 Time: [id number] SLP Time Calculation (min) (ACUTE ONLY): 23 min  Assessment / Plan / Recommendation Clinical Impression  Treatment focused on swallow safety, cognition and dysarthria. She required moderate verbal and tactile assist to attend to speaker and find cup on left field of environment as well as recognizing difficulty attending to her left.  Continued education with pt/family re: reasoning for providing cues for left attention. Pt imitated SLP modeling use of over articulation with speech phonemes ("ew" and "e"). Pt provided self administered cup sips honey thick liquids with moderate verbal and tactile assist for small sips. Audible swallow and delayed throat clears due to possible penetration. SLP encouraged pt and family to continue to cough and throat clear to remove potential penetrates. SLP demonstrated thickening juice for family. Continue speech-cognitive and swallow intervention.      HPI HPI: [name] is a 81 y.o. female with a Past Medical History of atrial fibrillation not on anticoagulation (due to significant epistaxis), hypertension, hypothyroidism who presents today with the above noted complaint. Per patient, she was in her usual state of health, she went to bed last evening without any major issues. Around 4 AM this morning, she sustained a fall while attempting to get out of bed and could not get up. She was subsequently brought to the emergency room, which was found to have left-sided weakness, and left facial droop along with dysarthria. CT scan of the head showed a possible acute infarction involving the posterior right internal capsule.   Pertinent Vitals Pain Assessment: No/denies pain  SLP Plan  Continue with current plan of care    Recommendations Diet recommendations: Dysphagia 1  (puree);Honey-thick liquid Liquids provided via: Cup;No straw Medication Administration: Crushed with puree Supervision: Full supervision/cueing for compensatory strategies Compensations: Slow rate;Small sips/bites;Check for pocketing;Check for anterior loss Postural Changes and/or Swallow Maneuvers: Seated upright 90 degrees;Upright 30-60 min after meal              General recommendations: Rehab consult Oral Care Recommendations: Oral care Q4 per protocol Follow up Recommendations: Inpatient Rehab Plan: Continue with current plan of care    GO     Royce MacadamiaLitaker, Norris Bodley Willis 09/25/2014, 10:30 AM   Breck CoonsLisa Willis Lonell FaceLitaker M.Ed [employer]P Pager [phone]

## 2014-09-25 NOTE — Progress Notes (Signed)
Rehab admissions - Evaluated for possible admission.  I met with patient and 3 daughters.  Daughters work and no one can provide supervision after a potential inpatient rehab admission.  Also, patient has [facility] and patient is not able to afford the copays for inpatient rehab admission.  Family prefers to pursue SNF outside the hospital.  I will inform the social worker and case manager.  Call me for questions.  #[phone]

## 2014-09-25 NOTE — Progress Notes (Signed)
CARE MANAGEMENT NOTE 09/25/2014  Patient:  [name],[name]   Account Number:  [id number]  Date Initiated:  09/25/2014  Documentation initiated by:  Jiles CrockerHANDLER,Eldo Umanzor  Subjective/Objective Assessment:   ADMITTED WITH STROKE     Action/Plan:   CM FOLLOWING FOR DCP   Anticipated DC Date:  09/29/2014   Anticipated DC Plan:  POSSIBLY SKILLED NURSING FACILITY  In-house referral  Clinical Social Worker      DC Planning Services  CM consult       Status of service:  In process, will continue to follow Medicare Important Message given?   (If response is "NO", the following Medicare IM given date fields will be blank)  Per UR Regulation:  Reviewed for med. necessity/level of care/duration of stay  Comments:  2/9/2016Abelino Derrick- B Tationa Stech RN,BSN,MHA [phone]

## 2014-09-25 NOTE — Progress Notes (Signed)
STROKE TEAM PROGRESS NOTE   HISTORY [name] is an 81 y.o. female with history of atrial fibrillation on anticoagulation, hypertension and hypothyroidism, presenting with new onset left facial droop and slurred speech. Patient also had left-sided weakness and fell when attempted to get out of bed. She was last seen well at 10 PM on 09/22/2014. She's been on aspirin daily. CT scan of her head and equivocal acute infarction involving the posterior right internal capsule. NIH stroke score was 3. She was beyond time window for treatment consideration with TPA. She was admitted for further evaluation and treatment.   SUBJECTIVE (INTERVAL HISTORY) Her family is at the bedside.  Overall she feels her condition is unchanged. She passed her swallow and is currently on dysphagia 1 diet. Family prefers SNF for rehab  OBJECTIVE Temp:  [97.6 F (36.4 C)-98.6 F (37 C)] 98.6 F (37 C) (02/09 0928) Pulse Rate:  [63-85] 73 (02/09 1215) Cardiac Rhythm:  [-] Atrial fibrillation (02/09 0810) Resp:  [20] 20 (02/09 0928) BP: (150-207)/(70-91) 174/91 mmHg (02/09 1215) SpO2:  [94 %-96 %] 95 % (02/09 0928)   Recent Labs Lab 09/23/14 0536 09/23/14 1639 09/23/14 2146 09/24/14 0657 09/25/14 1133  GLUCAP 108* 97 109* 108* 130*    Recent Labs Lab 09/23/14 0511 09/23/14 0519 09/24/14 0751  NA 139 140 143  K 3.6 3.6 3.2*  CL 105 105 108  CO2 22  --  24  GLUCOSE 114* 113* 118*  BUN 24* 28* 20  CREATININE 1.57* 1.50* 1.38*  CALCIUM 9.1  --  8.9    Recent Labs Lab 09/23/14 0511  AST 24  ALT 15  ALKPHOS 69  BILITOT 0.8  PROT 6.8  ALBUMIN 3.9    Recent Labs Lab 09/23/14 0511 09/23/14 0519  WBC 11.3*  --   NEUTROABS 7.4  --   HGB 12.5 13.6  HCT 38.8 40.0  MCV 93.3  --   PLT 160  --    No results for input(s): CKTOTAL, CKMB, CKMBINDEX, TROPONINI in the last 168 hours.  Recent Labs  09/23/14 0511  LABPROT 14.0  INR 1.07    Recent Labs  09/23/14 0732  COLORURINE YELLOW   LABSPEC 1.010  PHURINE 5.5  GLUCOSEU NEGATIVE  HGBUR NEGATIVE  BILIRUBINUR NEGATIVE  KETONESUR NEGATIVE  PROTEINUR NEGATIVE  UROBILINOGEN 0.2  NITRITE NEGATIVE  LEUKOCYTESUR NEGATIVE       Component Value Date/Time   CHOL 141 09/24/2014 0751   TRIG 97 09/24/2014 0751   HDL 38* 09/24/2014 0751   CHOLHDL 3.7 09/24/2014 0751   VLDL 19 09/24/2014 0751   LDLCALC 84 09/24/2014 0751   Lab Results  Component Value Date   HGBA1C 5.7* 09/24/2014      Component Value Date/Time   LABOPIA NONE DETECTED 09/23/2014 0732   COCAINSCRNUR NONE DETECTED 09/23/2014 0732   LABBENZ NONE DETECTED 09/23/2014 0732   AMPHETMU NONE DETECTED 09/23/2014 0732   THCU NONE DETECTED 09/23/2014 0732   LABBARB NONE DETECTED 09/23/2014 0732     Recent Labs Lab 09/23/14 0511  ETH <5    No results found. Carotid Doppler  There is 1-39% bilateral ICA stenosis. Vertebral artery flow is antegrade.    2D Echocardiogram  EF 60-65% with no source of embolus.    PHYSICAL EXAM Frail elderly female not in distress. . Afebrile. Head is nontraumatic. Neck is supple without bruit.    Cardiac exam no murmur or gallop. Lungs are clear to auscultation. Distal pulses are well felt. Neurological Exam :  Awake alert oriented x 3 normal speech and language. Mild left lower face asymmetry. Tongue midline. No drift. Mild diminished fine finger movements on left. Orbits right over left upper extremity. Mild left grip weak.. Left hemi body decreased sensation . Impared LT finger to nose coordination.. Gait deferred.  ASSESSMENT/PLAN Ms. [name] is a 81 y.o. female with history of atrial fibrillation on anticoagulation, hypertension and hypothyroidism, presenting with new onset left facial droop and slurred speech. She did not receive IV t-PA due to beyond the time window.   Stroke:  Non-dominant right MCA infarct w/ M2 occlusion, embolic secondary to known atrial fibrillation   Resultant  Left hemiparesis, L  hemisensory deficit, L ataxia, dysphagia  MRI  Small R MCA infarcts. R pituitary microadenoma  MRA  R M2 occlusion  Carotid Doppler  No significant stenosis   2D Echo  No source of embolus   HgbA1c 5.7  Heparin 5000 units sq tid for VTE prophylaxis  DIET - DYS 1 , ST following swallow  aspirin 81 mg orally every day prior to admission, now on aspirin 300 mg suppository daily  Ongoing aggressive stroke risk factor management  Therapy recommendations:  Rehab   Disposition:  SNF    Atrial Fibrillation  Home meds:  Aspirin, continued in the hospital  Had refused to take coumadin. Had bled on pradaxa. Given age, recommend eliquis 2.5 mg daily once able to swallow.   Accelerated Hypertension   209/70 on arrival  BP 166-225/54-92 past 24h (09/25/2014 @ 3:09 PM)   Permissive hypertension (OK if < 220/120) but gradually normalize in 5-7 days   Hyperlipidemia  Home meds:  No statin  LDL 84, goal < 70  Add statin once able to swallow  Continue statin at discharge  Other Stroke Risk Factors  Advanced age  Other Active Problems  R pituitary microadenoma  Acute renal failure, holding losartan  Hospital day # 2    She has had an embolic right MCA infarct secondary to cardiogenic embolism from atrial fibrillation. She remains at high risk for recurrent strokes as well as she is at high risk for hemorrhagic complications given her advanced age. She has an incidental pituitary adenoma which can be managed conservatively given her advanced age and comorbidities.discussed with patient and family. Recommend low dose Eliquis 2.5 mg twice daily given advanced age and h/o nasal bleed on pradaxa. Stroke team will sign off I have attempted to contact this patient by phone with the following results:  Call. For questions. Delia HeadyPramod Dquan Cortopassi, MD Medical Director [facility] Pager: [phone] 09/25/2014 3:09 PM    To contact Stroke Continuity provider, please refer to  [email or web address]. After hours, contact General Neurology

## 2014-09-25 NOTE — Progress Notes (Signed)
Physical Therapy Treatment Patient Details Name: [name] MRN: [id number] DOB: [birth year] Today's Date: 09/25/2014    History of Present Illness Adm 09/23/14 with Lt sided weakness and difficulty speaking. MRI + small Rt MCA distribution infarcts (frontal, external capsule, insula). Also enlarged pituatary gland. BPs running >200 despite IV meds. PMHx- HTN, afib (not anticoagulated due to severe epistaxis), Rt THA    PT Comments    Patient internally distracted by her pain and perseverating on this throughout treatment (despite pre-medication for pain). Pt did report her pain was less after activity/movement. Increased tone in LUE and LLE today with AAROM at Lt shoulder and Lt elbow completed. Pt fatigued and with continued headache (8/10) after bed level activities and EOB/OOB deferred.   Follow Up Recommendations  SNF (Pt/family preference)     Equipment Recommendations  None recommended by PT    Recommendations for Other Services       Precautions / Restrictions Precautions Precautions: Fall Precaution Comments: pt reports fell on Rt shoulder "when this happened" +bruise    Mobility  Bed Mobility Overal bed mobility: Needs Assistance Bed Mobility: Rolling Rolling: Min assist;Max assist         General bed mobility comments: Lt- min A with rail; Rt- max assist  Transfers                 General transfer comment: unable to assess  Ambulation/Gait                 Stairs            Wheelchair Mobility    Modified Rankin (Stroke Patients Only) Modified Rankin (Stroke Patients Only) Pre-Morbid Rankin Score: Moderate disability Modified Rankin: Severe disability     Balance                                    Cognition Arousal/Alertness: Awake/alert Behavior During Therapy: WFL for tasks assessed/performed Overall Cognitive Status: Impaired/Different from baseline Area of Impairment: Awareness   Current Attention  Level: Sustained     Safety/Judgement: Decreased awareness of safety;Decreased awareness of deficits Awareness:  (not aware of severe numbness Lt side)   General Comments: perseverating on wanting an ice pack for Rt shoulder (repeated at least 15x)    Exercises General Exercises - Upper Extremity Shoulder Flexion: AAROM;Both;5 reps;Supine Shoulder Extension: AAROM;Left;Supine (pt able to activate in midrange) Elbow Flexion: AAROM;Left;5 reps;Supine Elbow Extension: AAROM;Left;5 reps General Exercises - Lower Extremity Ankle Circles/Pumps: AROM;AAROM;Right;Left;10 reps Heel Slides: AROM;Right;AAROM;Left;5 reps;Limitations Heel Slides Limitations: LLE tends to flex with hip abdct and ER Hip ABduction/ADduction: AROM;Right;10 reps Other Exercises Other Exercises: hip internal and external rotation x 5 RLE AROM    General Comments        Pertinent Vitals/Pain BP supine 184/70 (manual as dynamap would not read BP x 2 attempts)  Pain Assessment: 0-10 Pain Score: 8  Pain Location: all over, especially head and Rt shoulder Pain Intervention(s): Limited activity within patient's tolerance;Monitored during session;Premedicated before session;Repositioned;Patient requesting pain meds-RN notified;Ice applied    Home Living                      Prior Function            PT Goals (current goals can now be found in the care plan section) Acute Rehab PT Goals Patient Stated Goal: wants her head to stop hurting Progress towards PT  goals: Progressing toward goals    Frequency  Min 3X/week    PT Plan Discharge plan needs to be updated    Co-evaluation             End of Session   Activity Tolerance: Patient limited by fatigue Patient left: in bed;with bed alarm set;with family/visitor present (daughter using TV remote/call bell; plans to stay)     Time: 1610-9604 PT Time Calculation (min) (ACUTE ONLY): 39 min  Charges:  $Therapeutic Exercise: 23-37  mins $Therapeutic Activity: 8-22 mins                    G Codes:      Jaden Abreu 26-Sep-2014, 5:41 PM Pager [phone]

## 2014-09-26 LAB — BASIC METABOLIC PANEL
Anion gap: 11 (ref 5–15)
BUN: 23 mg/dL (ref 6–23)
CALCIUM: 8.6 mg/dL (ref 8.4–10.5)
CO2: 24 mmol/L (ref 19–32)
Chloride: 106 mmol/L (ref 96–112)
Creatinine, Ser: 1.23 mg/dL — ABNORMAL HIGH (ref 0.50–1.10)
GFR calc Af Amer: 43 mL/min — ABNORMAL LOW (ref >90)
GFR, EST NON AFRICAN AMERICAN: 37 mL/min — AB (ref >90)
Glucose, Bld: 136 mg/dL — ABNORMAL HIGH (ref 70–99)
Potassium: 2.7 mmol/L — CL (ref 3.5–5.1)
Sodium: 141 mmol/L (ref 135–145)

## 2014-09-26 LAB — GLUCOSE, CAPILLARY
GLUCOSE-CAPILLARY: 126 mg/dL — AB (ref 70–99)
Glucose-Capillary: 117 mg/dL — ABNORMAL HIGH (ref 70–99)
Glucose-Capillary: 119 mg/dL — ABNORMAL HIGH (ref 70–99)
Glucose-Capillary: 132 mg/dL — ABNORMAL HIGH (ref 70–99)
Glucose-Capillary: 118 mg/dL — ABNORMAL HIGH (ref 70–99)

## 2014-09-26 LAB — POTASSIUM: POTASSIUM: 3.6 mmol/L (ref 3.5–5.1)

## 2014-09-26 MED ORDER — SENNOSIDES-DOCUSATE SODIUM 8.6-50 MG PO TABS: 1.0000 | ORAL_TABLET | Freq: Every evening | ORAL | Status: AC | PRN

## 2014-09-26 MED ORDER — ATORVASTATIN CALCIUM 20 MG PO TABS: 20.0000 mg | ORAL_TABLET | Freq: Every day | ORAL | Status: DC

## 2014-09-26 MED ORDER — POTASSIUM CHLORIDE CRYS ER 20 MEQ PO TBCR: 40.0000 meq | EXTENDED_RELEASE_TABLET | Freq: Once | ORAL | Status: DC

## 2014-09-26 MED ORDER — AMLODIPINE BESYLATE 10 MG PO TABS: 10.0000 mg | ORAL_TABLET | Freq: Every day | ORAL | Status: AC

## 2014-09-26 MED ORDER — POTASSIUM CHLORIDE 10 MEQ/100ML IV SOLN
10.0000 meq | INTRAVENOUS | Status: AC
Start: 2014-09-26 — End: 2014-09-26
  Administered 2014-09-26 (×2): 10 meq via INTRAVENOUS
  Filled 2014-09-26: qty 100

## 2014-09-26 MED ORDER — ACETAMINOPHEN 325 MG PO TABS: 650.0000 mg | ORAL_TABLET | Freq: Four times a day (QID) | ORAL | Status: AC | PRN

## 2014-09-26 MED ORDER — HYDRALAZINE HCL 50 MG PO TABS
50.0000 mg | ORAL_TABLET | Freq: Three times a day (TID) | ORAL | Status: DC
  Administered 2014-09-26: 50 mg via ORAL
  Filled 2014-09-26: qty 1

## 2014-09-26 MED ORDER — APIXABAN 2.5 MG PO TABS: 2.5000 mg | ORAL_TABLET | Freq: Two times a day (BID) | ORAL | Status: AC

## 2014-09-26 MED ORDER — POTASSIUM CHLORIDE CRYS ER 20 MEQ PO TBCR
40.0000 meq | EXTENDED_RELEASE_TABLET | ORAL | Status: AC
  Administered 2014-09-26 (×2): 40 meq via ORAL
  Filled 2014-09-26 (×2): qty 2

## 2014-09-26 MED ORDER — AMLODIPINE BESYLATE 10 MG PO TABS
10.0000 mg | ORAL_TABLET | Freq: Every day | ORAL | Status: DC
  Administered 2014-09-26: 10 mg via ORAL
  Filled 2014-09-26: qty 1

## 2014-09-26 MED ORDER — ATORVASTATIN CALCIUM 20 MG PO TABS: 20.0000 mg | ORAL_TABLET | Freq: Every day | ORAL | Status: AC

## 2014-09-26 MED ORDER — ALBUTEROL SULFATE (2.5 MG/3ML) 0.083% IN NEBU: 2.5000 mg | INHALATION_SOLUTION | RESPIRATORY_TRACT | Status: AC | PRN

## 2014-09-26 MED ORDER — POTASSIUM CHLORIDE ER 10 MEQ PO TBCR: 10.0000 meq | EXTENDED_RELEASE_TABLET | Freq: Every day | ORAL | Status: AC

## 2014-09-26 MED ORDER — HYDRALAZINE HCL 50 MG PO TABS: 50.0000 mg | ORAL_TABLET | Freq: Three times a day (TID) | ORAL | Status: AC

## 2014-09-26 NOTE — Progress Notes (Signed)
[facility] give report to nurse, Jeannette Corpusherisita.

## 2014-09-26 NOTE — Clinical Social Work Placement (Addendum)
Clinical Social Work Department CLINICAL SOCIAL WORK PLACEMENT NOTE 09/26/2014  Patient:  [name],[name]  Account Number:  [id number] Admit date:  09/23/2014  Clinical Social Worker:  Derenda FennelBASHIRA Adler Chartrand, CLINICAL SOCIAL WORKER  Date/time:  09/26/2014 09:04 PM  Clinical Social Work is seeking post-discharge placement for this patient at the following level of care:   SKILLED NURSING   (*CSW will update this form in Epic as items are completed)   09/26/2014  [facility]/family provided with [facility] of Clinical Social Work's list of facilities offering this level of care within the geographic area requested by the patient (or if unable, by the patient's family).  09/26/2014  Patient/family informed of their freedom to choose among providers that offer the needed level of care, that participate in Medicare, Medicaid or managed care program needed by the patient, have an available bed and are willing to accept the patient.  09/26/2014  Patient/family informed of [facility]' ownership interest in [facility], as well as of the fact that they are under no obligation to receive care at this facility.  PASARR submitted to EDS on 09/26/2014 PASARR number received on 09/26/2014  FL2 transmitted to all facilities in geographic area requested by pt/family on  09/26/2014 FL2 transmitted to all facilities within larger geographic area on   Patient informed that his/her managed care company has contracts with or will negotiate with  certain facilities, including the following:   YES     Patient/family informed of bed offers received:  09/26/2014 [facility] Physician recommends and patient chooses bed at    [facility]  09/26/2014 Patient to be transferred to facility by [facility] Patient and family notified of transfer on 09/26/2014 Name of family member notified:  Pt's dtr,  Lupita Leashonna and family  The following physician request were entered in Epic:   Additional Comments:   Derenda FennelBashira Taimi Towe, MSW, LCSWA [phone] 09/26/2014 9:05 PM

## 2014-09-26 NOTE — Progress Notes (Signed)
Spoke with Md and notified her of pt's stat potassium level. Md stated that pt can be discharged. Left a message for oncall CSW, Sue Lushndrea. Pt family at bedside, made aware.

## 2014-09-26 NOTE — Clinical Social Work Note (Signed)
Clinical Social Worker facilitated patient discharge including contacting patient family and facility to confirm patient discharge plans.  Clinical information faxed to facility and family agreeable with plan.  CSW prompted RN to arrange ambulance transport via [facility] to [facility] . [facility] RN to call report prior to discharge.  DC packet prepared and on chart for transport.   Clinical Social Worker will sign off for now as social work intervention is no longer needed. Please consult us again if new need arises.  Derenda FennelBashira Annjeanette Sarwar, MSW, LCSWA [phone] 09/26/2014 9:07 PM

## 2014-09-26 NOTE — Discharge Summary (Signed)
Physician Discharge Summary  Patient ID: [name] MRN: [id number] DOB/AGE: [birth year] 81 y.o.  Admit date: 09/23/2014 Discharge date: 09/26/2014  Primary Care Physician:  Lorenda Peck, MD  Discharge Diagnoses:   . Acute CVA (cerebrovascular accident) with left-sided hemiparesis, dysarthria  . Atrial fibrillation, unspecified . essential Hypertension . ARF (acute renal failure) . Dysphagia, pharyngoesophageal phase  Consults:  Neurology, Dr. Pearlean Brownie Inpatient rehabilitation Speech therapy PT, OT   Recommendations for Outpatient Follow-up:  Please continue swallow evaluation, physical therapy and speech therapy.   Discharge diet: Dysphagia 1 with honey thick liquids, pills crushed in pure, supervision for all intake  Please note patient is started on eliquis 2.5mg  BID due to atrial fibrillation and cardioembolic stroke  Please follow BMET, K and renal function, LFTs (on statin) tomorrow  Antihypertensives may need to be titrated up, if BP still uncontrolled, may need to restart losartan 100 mg daily. Follow renal function  MRI of the brain also showed incidental pituitary microadenoma, given her age and overall comorbidities, conservative management recommended   TESTS THAT NEED FOLLOW-UP BMET   Allergies:   Allergies  Allergen Reactions  . Penicillins Rash     Discharge Medications:   Medication List    STOP taking these medications        aspirin 81 MG tablet     losartan 100 MG tablet  Commonly known as:  COZAAR     oxyCODONE-acetaminophen 5-325 MG per tablet  Commonly known as:  PERCOCET/ROXICET     TRAMADOL HCL PO     triamterene-hydrochlorothiazide 37.5-25 MG per tablet  Commonly known as:  MAXZIDE-25     triamterene-hydrochlorothiazide 50-25 MG per capsule  Commonly known as:  DYAZIDE     triamterene-hydrochlorothiazide 75-50 MG per tablet  Commonly known as:  MAXZIDE      TAKE these medications        acetaminophen 325 MG  tablet  Commonly known as:  TYLENOL  Take 2 tablets (650 mg total) by mouth every 6 (six) hours as needed for moderate pain (headache).     albuterol (2.5 MG/3ML) 0.083% nebulizer solution  Commonly known as:  PROVENTIL  Take 3 mLs (2.5 mg total) by nebulization every 2 (two) hours as needed for wheezing.     amLODipine 10 MG tablet  Commonly known as:  NORVASC  Take 1 tablet (10 mg total) by mouth daily.     apixaban 2.5 MG Tabs tablet  Commonly known as:  ELIQUIS  Take 1 tablet (2.5 mg total) by mouth 2 (two) times daily.     atorvastatin 20 MG tablet  Commonly known as:  LIPITOR  Take 1 tablet (20 mg total) by mouth at bedtime.     digoxin 0.125 MG tablet  Commonly known as:  LANOXIN  Take 0.125 mg by mouth daily.     hydrALAZINE 50 MG tablet  Commonly known as:  APRESOLINE  Take 1 tablet (50 mg total) by mouth every 8 (eight) hours.     levothyroxine 50 MCG tablet  Commonly known as:  SYNTHROID, LEVOTHROID  Take 50 mcg by mouth daily before breakfast.     potassium chloride 10 MEQ tablet  Commonly known as:  K-DUR  Take 1 tablet (10 mEq total) by mouth daily.     senna-docusate 8.6-50 MG per tablet  Commonly known as:  Senokot-S  Take 1 tablet by mouth at bedtime as needed for mild constipation.         Brief H and P:  For complete details please refer to admission H and P, but in brief [name] is a 81 y.o. female with a Past Medical History of atrial fibrillation not on anticoagulation (due to significant epistaxis), hypertension, hypothyroidism who presents today with the above noted complaint. Per patient, she was in her usual state of health, she went to bed the night before the admission without any major issues. Around 4 AM on the morning of admission, she sustained a fall while attempting to get out of bed and could not get up. She was subsequently brought to the emergency room, which was found to have left-sided weakness, and left facial droop along  with dysarthria. CT scan of the head showed a possible acute infarction involving the posterior right internal capsule. Neurology was consulted   Hospital Course:   Acute CVA (cerebrovascular accident): Likely cardioembolic CVA given history of atrial fibrillation resulted with left-sided hemiparesis, dysarthria and residual dysphagia - MRI of the brain showed small areas of acute infarction in the right MCA territory - MRA showed occlusion of right M2 inferior division, microadenoma of the pituitary gland -2-D echo showed EF of 60-65%, grade 3 diastolic dysfunction, moderately to severely dilated right atrium, moderate TR, pulmonary hypertension  -Carotid Dopplers showed 1-39% ICA stenosis  - Patient has significant dysarthria with dysphagia, residual from acute CVA but tolerated a dysphagia 1 diet  - lipid panel showed LDL 84, goal <70, placed on statins - Hemoglobin A1c 5.7  - PTOT evaluation recommended skilled nursing facility - Due to history of persistent atrial fibrillation, patient was started on eliquis per neurology recommendations., See below   Atrial fibrillation, unspecified - Continue digoxin - Patient has been on anticoagulation in the past, PRADAXA however per family, this was discontinued due to severe epistaxis. Her PCP had placed her on aspirin. Neurology recommended low-dose eliquis 2.5mg  BID, given advanced age and history of epistaxis on pradaxa. Aspirin was discontinued.    Hypertension - placed on Norvasc and hydralazine. Patient was on triamterene, HCTZ and lisinopril prior to admission, creatinine function was elevated at 1.57 at the time of admission hands these 3 antihypertensives have been held.   ARF (acute renal failure) - Creatinine has improved to 1.3, Continue to hold HCTZ, lisinopril and triamterene. IV fluids have been discontinued, creatinine has been trending down.   Dysphagia, pharyngoesophageal phase - passed swallow testing, started  on pured diet    Hypokalemia Patient will be given oral and IV potassium prior to the discharge, will recheck potassium prior to DC, placed on daily replacement   Day of Discharge BP 174/62 mmHg  Pulse 60  Temp(Src) 98.8 F (37.1 C) (Oral)  Resp 18  Ht 5\' 3"  (1.6 m)  Wt 74.844 kg (165 lb)  BMI 29.24 kg/m2  SpO2 98%  Physical Exam: General: Alert and awake oriented, dysarthria with left-sided facial droop CVS: S1-S2 clear no murmur rubs or gallops Chest: Decreased breath sound at the bases Abdomen: soft nontender, nondistended, normal bowel sounds Extremities: no cyanosis, clubbing or edema noted bilaterally Neuro: left upper extremities 0/5, left lower extremity 3/5, right upper extremity and lower extremity 5/5, dysarthria and facial droop   The results of significant diagnostics from this hospitalization (including imaging, microbiology, ancillary and laboratory) are listed below for reference.    LAB RESULTS: Basic Metabolic Panel:  Recent Labs Lab 09/24/14 0751 09/26/14 1115  NA 143 141  K 3.2* 2.7*  CL 108 106  CO2 24 24  GLUCOSE 118* 136*  BUN  20 23  CREATININE 1.38* 1.23*  CALCIUM 8.9 8.6   Liver Function Tests:  Recent Labs Lab 09/23/14 0511  AST 24  ALT 15  ALKPHOS 69  BILITOT 0.8  PROT 6.8  ALBUMIN 3.9   No results for input(s): LIPASE, AMYLASE in the last 168 hours. No results for input(s): AMMONIA in the last 168 hours. CBC:  Recent Labs Lab 09/23/14 0511 09/23/14 0519  WBC 11.3*  --   NEUTROABS 7.4  --   HGB 12.5 13.6  HCT 38.8 40.0  MCV 93.3  --   PLT 160  --    Cardiac Enzymes: No results for input(s): CKTOTAL, CKMB, CKMBINDEX, TROPONINI in the last 168 hours. BNP: Invalid input(s): POCBNP CBG:  Recent Labs Lab 09/26/14 0644 09/26/14 1116  GLUCAP 119* 132*    Significant Diagnostic Studies:  Dg Shoulder Right  09/23/2014   CLINICAL DATA:  Right shoulder pain after fall out of bed.  EXAM: RIGHT SHOULDER - 2+ VIEW   COMPARISON:  None.  FINDINGS: No fracture or dislocation. Transscapular Y-view slightly limited due to soft tissue attenuation. Mild proliferative change at the acromioclavicular joint.  IMPRESSION: No fracture or dislocation of the right shoulder.   Electronically Signed   By: Rubye Oaks M.D.   On: 09/23/2014 06:32   Ct Head Wo Contrast  09/23/2014   CLINICAL DATA:  Code stroke.  Left facial droop.  EXAM: CT HEAD WITHOUT CONTRAST  TECHNIQUE: Contiguous axial images were obtained from the base of the skull through the vertex without intravenous contrast.  COMPARISON:  None.  FINDINGS: No intracranial hemorrhage. There is a questionable small focus of decreased density in the right posterior limb of the internal capsule that may reflect a small lacunar infarct versus volume averaging. There is no territorial infarct. Mild periventricular white matter change. No hydrocephalus. The basilar cisterns are patent. No intracranial fluid collection. Calvarium is intact. Included paranasal sinuses and mastoid air cells are well aerated.  IMPRESSION: 1. Questionable small lacunar infarct posterior limb right internal capsule. 2. No intracranial hemorrhage. These results were called by telephone at the time of interpretation on 09/23/2014 at 5:33 am to Dr. Karma Ganja, who verbally acknowledged these results.   Electronically Signed   By: Rubye Oaks M.D.   On: 09/23/2014 05:34   Mr Brain Wo Contrast  09/23/2014   CLINICAL DATA:  Left-sided weakness and left facial droop with dysarthria. Stroke.  EXAM: MRI HEAD WITHOUT CONTRAST  MRA HEAD WITHOUT CONTRAST  TECHNIQUE: Multiplanar, multiecho pulse sequences of the brain and surrounding structures were obtained without intravenous contrast. Angiographic images of the head were obtained using MRA technique without contrast.  COMPARISON:  Head CT 09/23/2014  FINDINGS: MRI HEAD FINDINGS  The pituitary gland is prominent in size and appears asymmetrically enlarged on the right  with a convex superior margin and heterogeneity suggestive of an underlying lesion. The right aspect of the gland measures 10 mm in craniocaudal height. There is no evidence of suprasellar extension. There are small regions of restricted diffusion in the right MCA territory involving right frontal cortex and white matter, external capsule, insula, and temporal operculum. Small foci of chronic microhemorrhage are noted in the left frontal lobe and right parietal lobe. Mild generalized cerebral atrophy is within normal limits for age. There is no evidence of mass, midline shift, or extra-axial fluid collection. Small foci of T2 hyperintensity in the cerebral white matter bilaterally are nonspecific but compatible with mild chronic small vessel ischemic disease.  Prior  bilateral cataract extraction is noted. Paranasal sinuses are clear. There is trace right mastoid air cell fluid. Abnormal right MCA branch vessel flow voids are more fully evaluated below.  MRA HEAD FINDINGS  The visualized distal vertebral arteries are patent with the right being slightly dominant. PICA and SCA origins are patent. Basilar artery is patent without stenosis. There is a fetal type origin of the right PCA. PCAs are otherwise unremarkable. Both the  Internal carotid arteries are patent from skullbase to carotid termini. Right M1 segment is patent without stenosis. There is occlusion of the right M2 inferior division trunk just beyond its origin. The superior division appears patent. ACAs and left MCA are unremarkable. No intracranial aneurysm is identified.  IMPRESSION: 1. Small areas of acute infarction in the right MCA territory. 2. Occlusion of the right M2 inferior division. 3. Asymmetric, mild enlargement of the pituitary gland on the right suggestive of an underlying lesion, such as a microadenoma. If clinically warranted, elective pituitary protocol brain MRI could be performed for further evaluation.   Electronically Signed   By:  Sebastian AcheAllen  Grady   On: 09/23/2014 11:11   Dg Knee Complete 4 Views Right  09/23/2014   CLINICAL DATA:  Right knee pain after fall.  EXAM: RIGHT KNEE - COMPLETE 4+ VIEW  COMPARISON:  None.  FINDINGS: Two partially cannulated screws traverse the proximal tibia, no periprosthetic lucency or fracture. No fracture or dislocation. The alignment is maintained. There is mild osteoarthritis. No joint effusion.  IMPRESSION: Mild osteoarthritis.  No fracture or dislocation.   Electronically Signed   By: Rubye OaksMelanie  Ehinger M.D.   On: 09/23/2014 06:31   Mr Maxine GlennMra Head/brain Wo Cm  09/23/2014   CLINICAL DATA:  Left-sided weakness and left facial droop with dysarthria. Stroke.  EXAM: MRI HEAD WITHOUT CONTRAST  MRA HEAD WITHOUT CONTRAST  TECHNIQUE: Multiplanar, multiecho pulse sequences of the brain and surrounding structures were obtained without intravenous contrast. Angiographic images of the head were obtained using MRA technique without contrast.  COMPARISON:  Head CT 09/23/2014  FINDINGS: MRI HEAD FINDINGS  The pituitary gland is prominent in size and appears asymmetrically enlarged on the right with a convex superior margin and heterogeneity suggestive of an underlying lesion. The right aspect of the gland measures 10 mm in craniocaudal height. There is no evidence of suprasellar extension. There are small regions of restricted diffusion in the right MCA territory involving right frontal cortex and white matter, external capsule, insula, and temporal operculum. Small foci of chronic microhemorrhage are noted in the left frontal lobe and right parietal lobe. Mild generalized cerebral atrophy is within normal limits for age. There is no evidence of mass, midline shift, or extra-axial fluid collection. Small foci of T2 hyperintensity in the cerebral white matter bilaterally are nonspecific but compatible with mild chronic small vessel ischemic disease.  Prior bilateral cataract extraction is noted. Paranasal sinuses are clear. There  is trace right mastoid air cell fluid. Abnormal right MCA branch vessel flow voids are more fully evaluated below.  MRA HEAD FINDINGS  The visualized distal vertebral arteries are patent with the right being slightly dominant. PICA and SCA origins are patent. Basilar artery is patent without stenosis. There is a fetal type origin of the right PCA. PCAs are otherwise unremarkable. Both the  Internal carotid arteries are patent from skullbase to carotid termini. Right M1 segment is patent without stenosis. There is occlusion of the right M2 inferior division trunk just beyond its origin. The superior division appears  patent. ACAs and left MCA are unremarkable. No intracranial aneurysm is identified.  IMPRESSION: 1. Small areas of acute infarction in the right MCA territory. 2. Occlusion of the right M2 inferior division. 3. Asymmetric, mild enlargement of the pituitary gland on the right suggestive of an underlying lesion, such as a microadenoma. If clinically warranted, elective pituitary protocol brain MRI could be performed for further evaluation.   Electronically Signed   By: Sebastian Ache   On: 09/23/2014 11:11    2D ECHO: Study Conclusions  - Left ventricle: The cavity size was normal. Wall thickness was increased in a pattern of mild LVH. Systolic function was normal. The estimated ejection fraction was in the range of 60% to 65%. Wall motion was normal; there were no regional wall motion abnormalities. Doppler parameters are consistent with a reversible restrictive pattern, indicative of decreased left ventricular diastolic compliance and/or increased left atrial pressure (grade 3 diastolic dysfunction). - Aortic valve: There was mild regurgitation. - Mitral valve: Calcified annulus. There was mild regurgitation. - Left atrium: The atrium was severely dilated. - Right atrium: The atrium was moderately to severely dilated. - Tricuspid valve: There was moderate regurgitation. -  Pulmonary arteries: Systolic pressure was severely increased. PA peak pressure: 73 mm Hg (S).  Impressions:  - No cardiac source of emboli was indentified.  Disposition and Follow-up: Discharge Instructions    Discharge instructions    Complete by:  As directed   Diet: Dysphagia 1 pure Diet     Increase activity slowly    Complete by:  As directed             DISPOSITION: SNF   DISCHARGE FOLLOW-UP Follow-up Information    Follow up with ROBERTS, Vernie Ammons, MD. Schedule an appointment as soon as possible for a visit in 2 weeks.   Specialty:  Internal Medicine   Why:  for hospital follow-up, obtain labs BMET   Contact information:   [address] [place] [address] [phone]       Follow up with SETHI,PRAMOD, MD. Schedule an appointment as soon as possible for a visit in 2 months.   Specialties:  Neurology, Radiology   Why:  for hospital follow-up/stroke follow-up   Contact information:   [address] [place] [address] [phone]        Time spent on Discharge: 40 mins  Signed:   RAI,RIPUDEEP M.D. [facility] 09/26/2014, 12:58 PM Pager: [phone]

## 2014-09-26 NOTE — Progress Notes (Signed)
Spoke to CSW, ThrustonBashira. [facility]

## 2014-09-26 NOTE — Progress Notes (Signed)
Received verbal order from MD to administer po and IV potassium. Lab just redrew potassium level stat.

## 2014-09-26 NOTE — Clinical Social Work Psychosocial (Signed)
Clinical Social Work Department BRIEF PSYCHOSOCIAL ASSESSMENT 09/26/2014  Patient:  Catherine, Clark     Account Number:  [id number]     Admit date:  09/23/2014  Clinical Social Worker:  Glendon Axe, CLINICAL SOCIAL WORKER  Date/Time:  09/26/2014 08:50 PM  Referred by:  Physician  Date Referred:  09/26/2014 Referred for  SNF Placement   Other Referral:   Interview type:  Other - See comment Other interview type:   CSW met with patient and pt's dtr's present at bedside.    PSYCHOSOCIAL DATA Living Status:  ALONE Admitted from facility:   Level of care:   Primary support name:  [name] Primary support relationship to patient:  CHILD, ADULT Degree of support available:   Strong    CURRENT CONCERNS Current Concerns  Post-Acute Placement   Other Concerns:    SOCIAL WORK ASSESSMENT / PLAN Clinical Social Worker spoke with patient and pt's dtr's at length in reference to post-acute placement for SNF. CSW explained CSW role and SNF process. CSW also reviewed and provided SNF list with pt and pt's family. Pt's dtr's reported the family would like pt placed at [facility] and [facility]. Pt's family also expressed concerns with pt's mobility and returning home alone. Pt's dtr, [name] stated pt lives alone and that family would like pt to receive thearpy before returning. Pt's family gathered around pt's bed and very involved/supportive of pt's care. CSW will continue to provide support and facilitate pt's discharge needs once medically stable.   Assessment/plan status:  Psychosocial Support/Ongoing Assessment of Needs Other assessment/ plan:   FL-2 completed and signed.   Information/referral to community resources:   SNF information/list provided.    PATIENT'S/FAMILY'S RESPONSE TO PLAN OF CARE: Pt alert, oriented and asleep. Pt's family present and agreeable to SNF placement at [facility]. Pt's family concerned in regards to pt's mobility and feels she should take it  "easy". Pt's family asked several questions in reference to items they needed for [facility] admissions office. Pt's family strongly involved and supportive. Pt and pt's family very pleasant to work with and appreciated social work intervention.       Glendon Axe, MSW, [place] [phone] 09/26/2014 9:03 PM

## 2015-04-18 DEATH — deceased

## 2016-10-01 IMAGING — CT CT HEAD W/O CM
1 series · 16 of 30 positions shown, 20 images · non-contrast
Comparison: None.

CLINICAL DATA: Code stroke.  Left facial droop.

EXAM:
CT HEAD WITHOUT CONTRAST
TECHNIQUE: Contiguous axial images were obtained from the base of the skull
through the vertex without intravenous contrast.

[Series 2: head 5.0 h30s · axial · 0.41mm/px · z∈[-129,+6]mm · 16 of 31 slices shown, 20 images]
[im 2/31  brain]
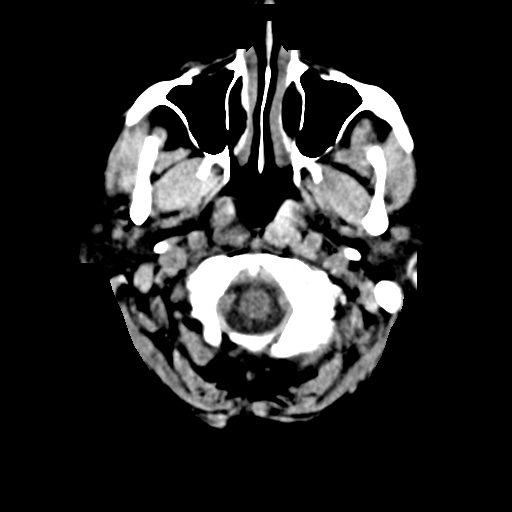
[im 2/31  bone]
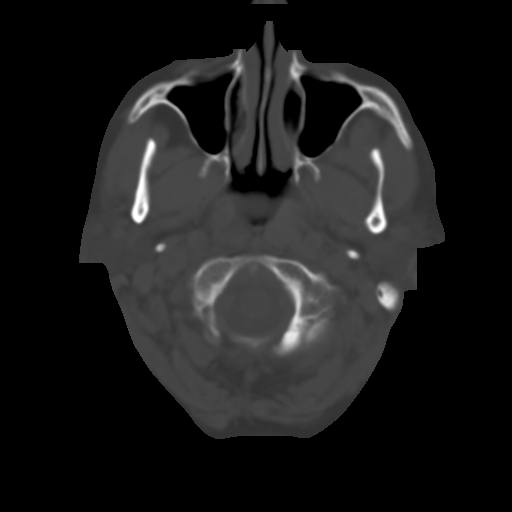
[im 4/31  brain]
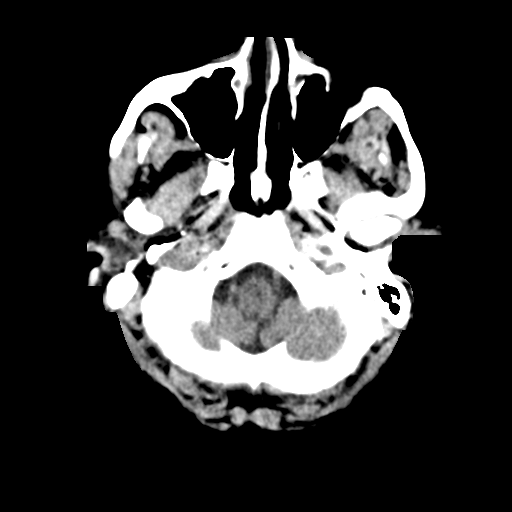
[im 6/31  brain]
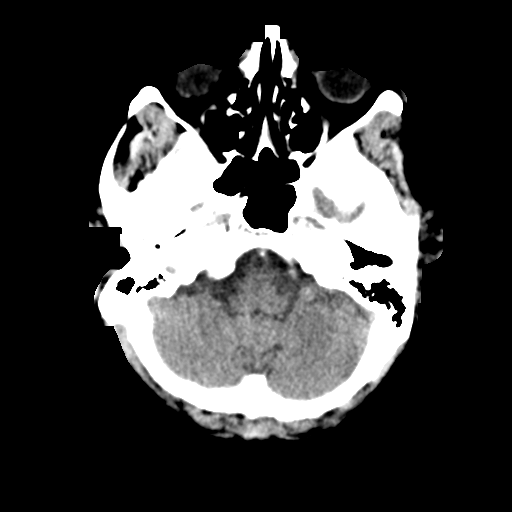
[im 8/31  brain]
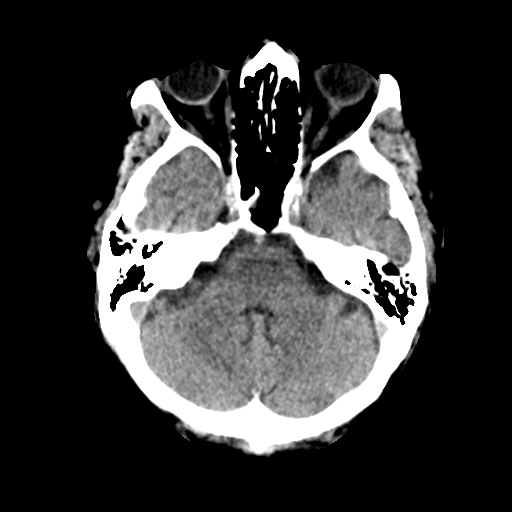
[im 9/31  brain]
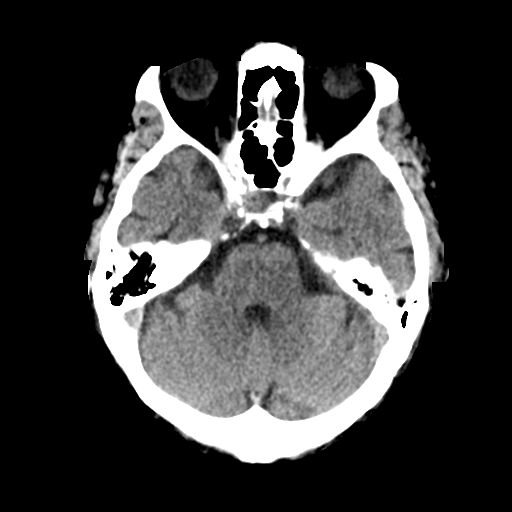
[im 9/31  bone]
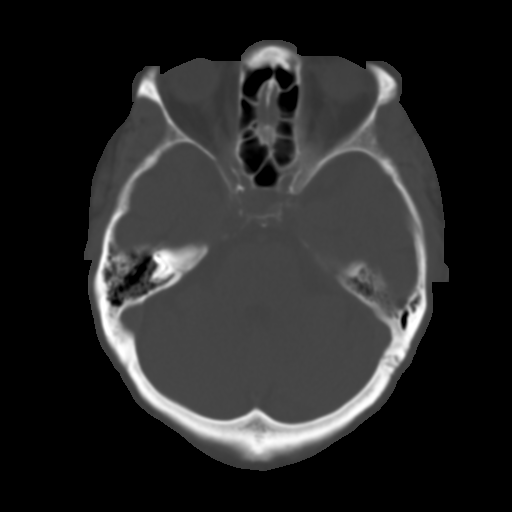
[im 11/31  brain]
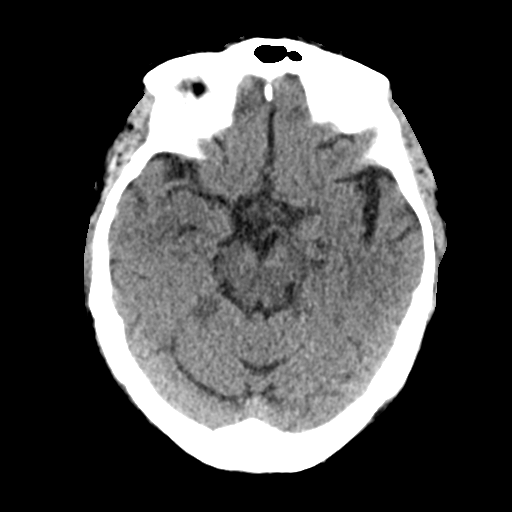
[im 13/31  brain]
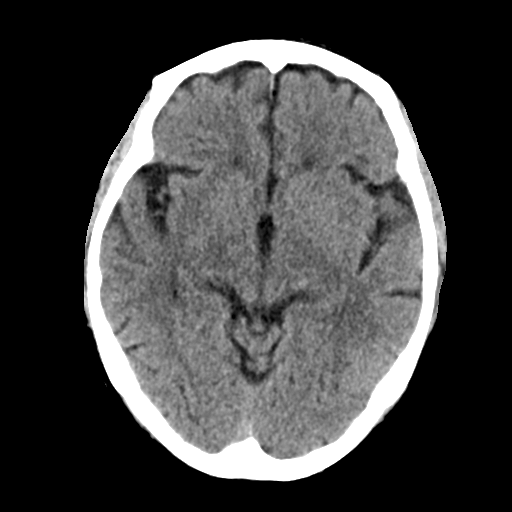
[im 15/31  brain]
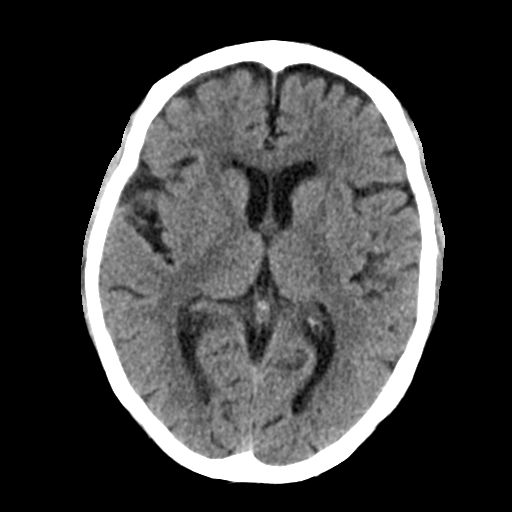
[im 16/31  brain]
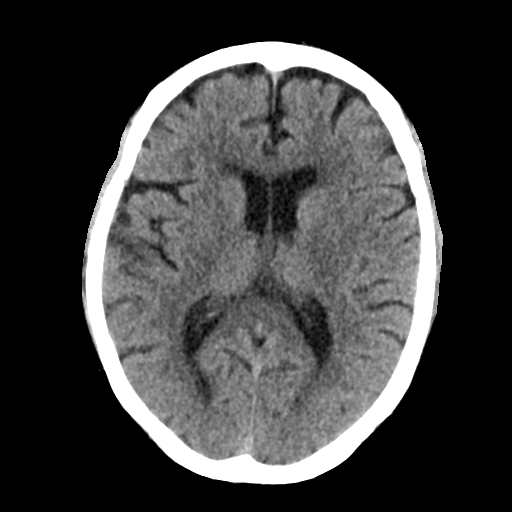
[im 16/31  bone]
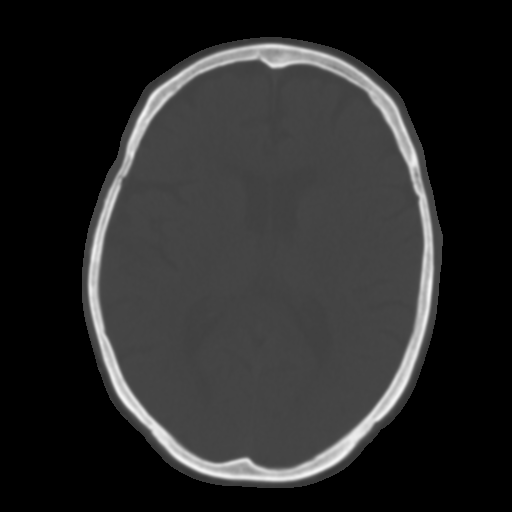
[im 18/31  brain]
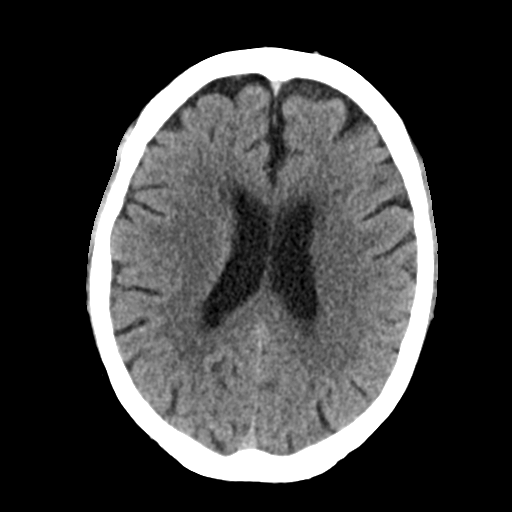
[im 20/31  brain]
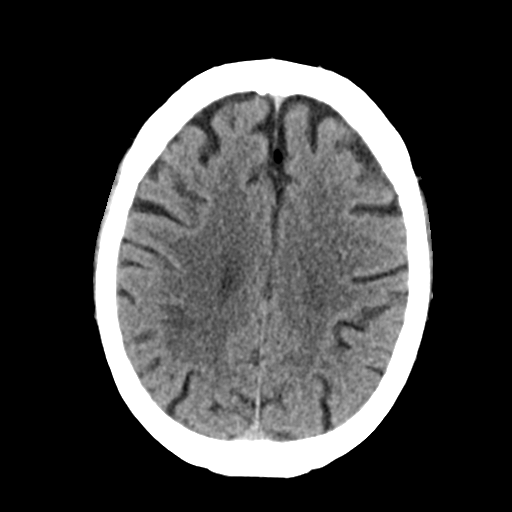
[im 22/31  brain]
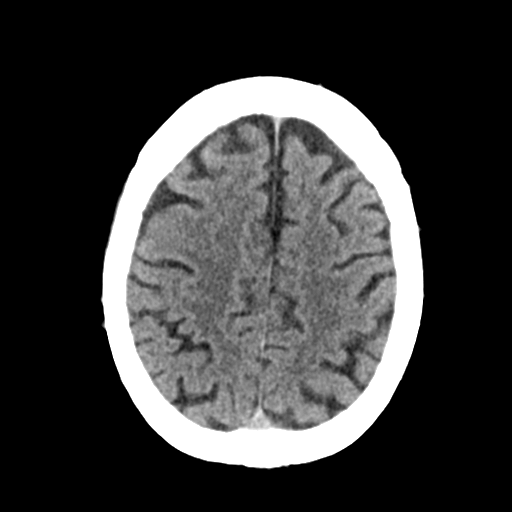
[im 23/31  brain]
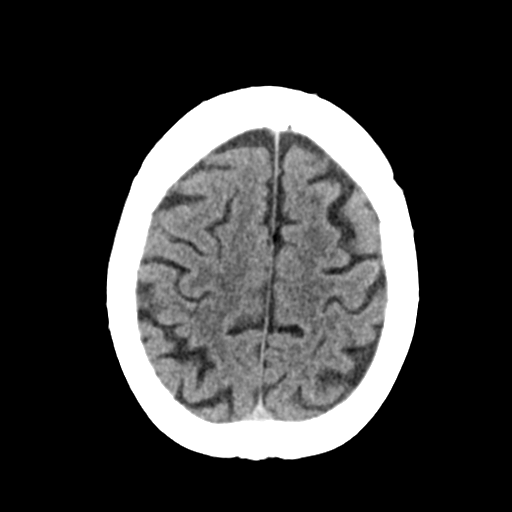
[im 23/31  bone]
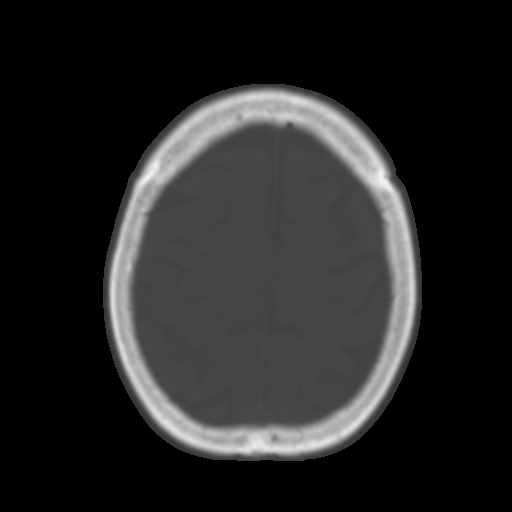
[im 25/31  brain]
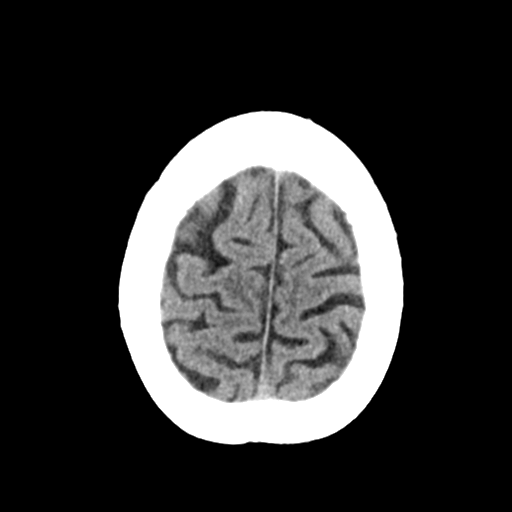
[im 27/31  brain]
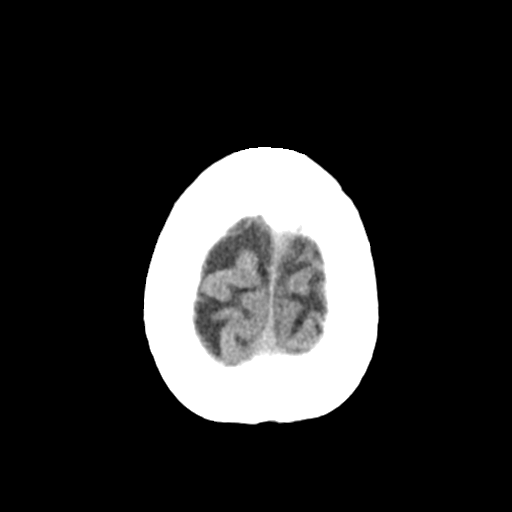
[im 29/31  brain]
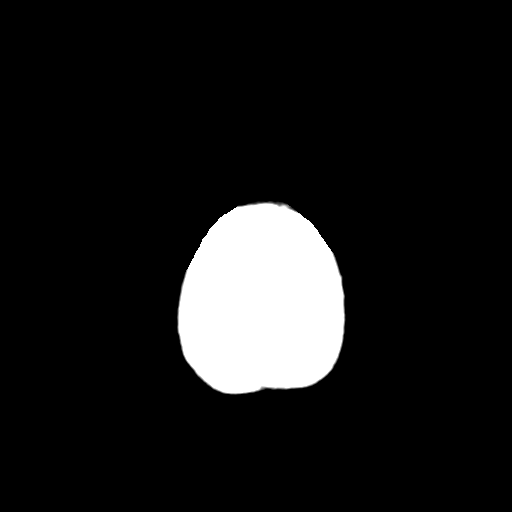

[16 of 30 positions shown; findings below may reference images not displayed]

FINDINGS: No intracranial hemorrhage. There is a questionable small focus of
decreased density in the right posterior limb of the internal
capsule that may reflect a small lacunar infarct versus volume
averaging. There is no territorial infarct. Mild periventricular
white matter change. No hydrocephalus. The basilar cisterns are
patent. No intracranial fluid collection. Calvarium is intact.
Included paranasal sinuses and mastoid air cells are well aerated.
IMPRESSION: 1. Questionable small lacunar infarct posterior limb right internal
capsule.
2. No intracranial hemorrhage.
These results were called by telephone at the time of interpretation
on 09/23/2014 at [DATE] to Dr. [place], who verbally acknowledged
these results.

## 2016-10-01 IMAGING — CR DG KNEE COMPLETE 4+V*R*
4 series · 4 of 4 positions shown · non-contrast
Comparison: None.

CLINICAL DATA: Right knee pain after fall.

EXAM:
RIGHT KNEE - COMPLETE 4+ VIEW

[knee ap]
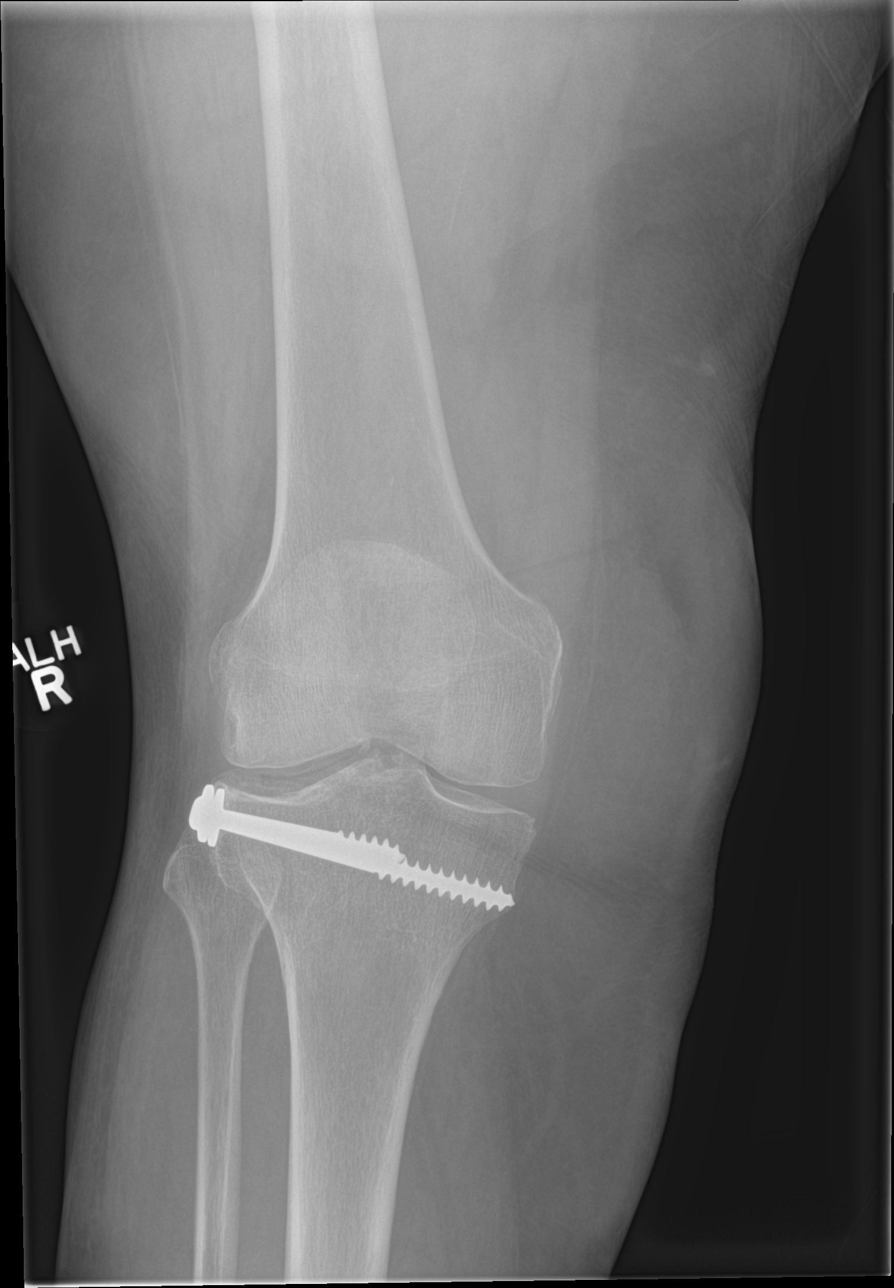

[knee obl (1 of 2)]
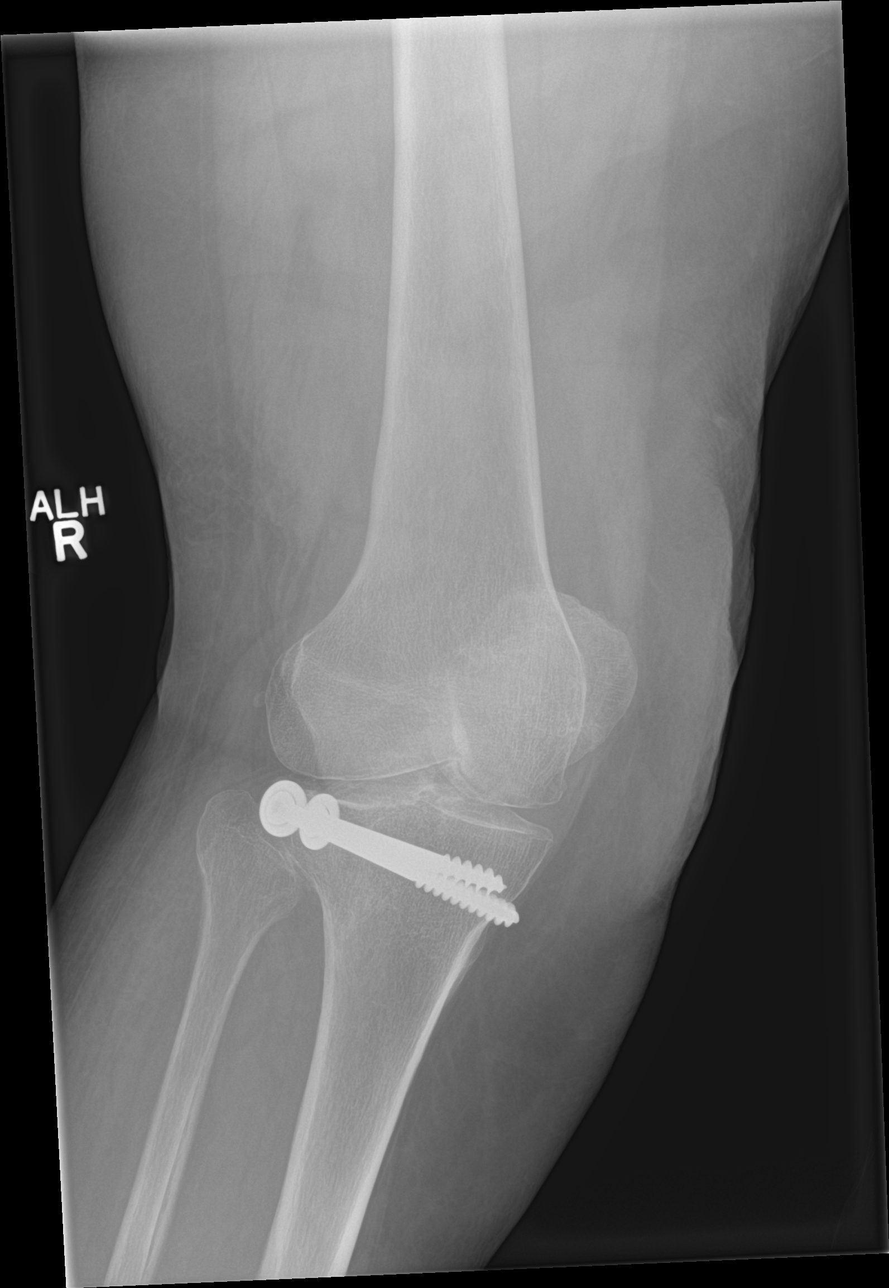

[knee obl (2 of 2)]
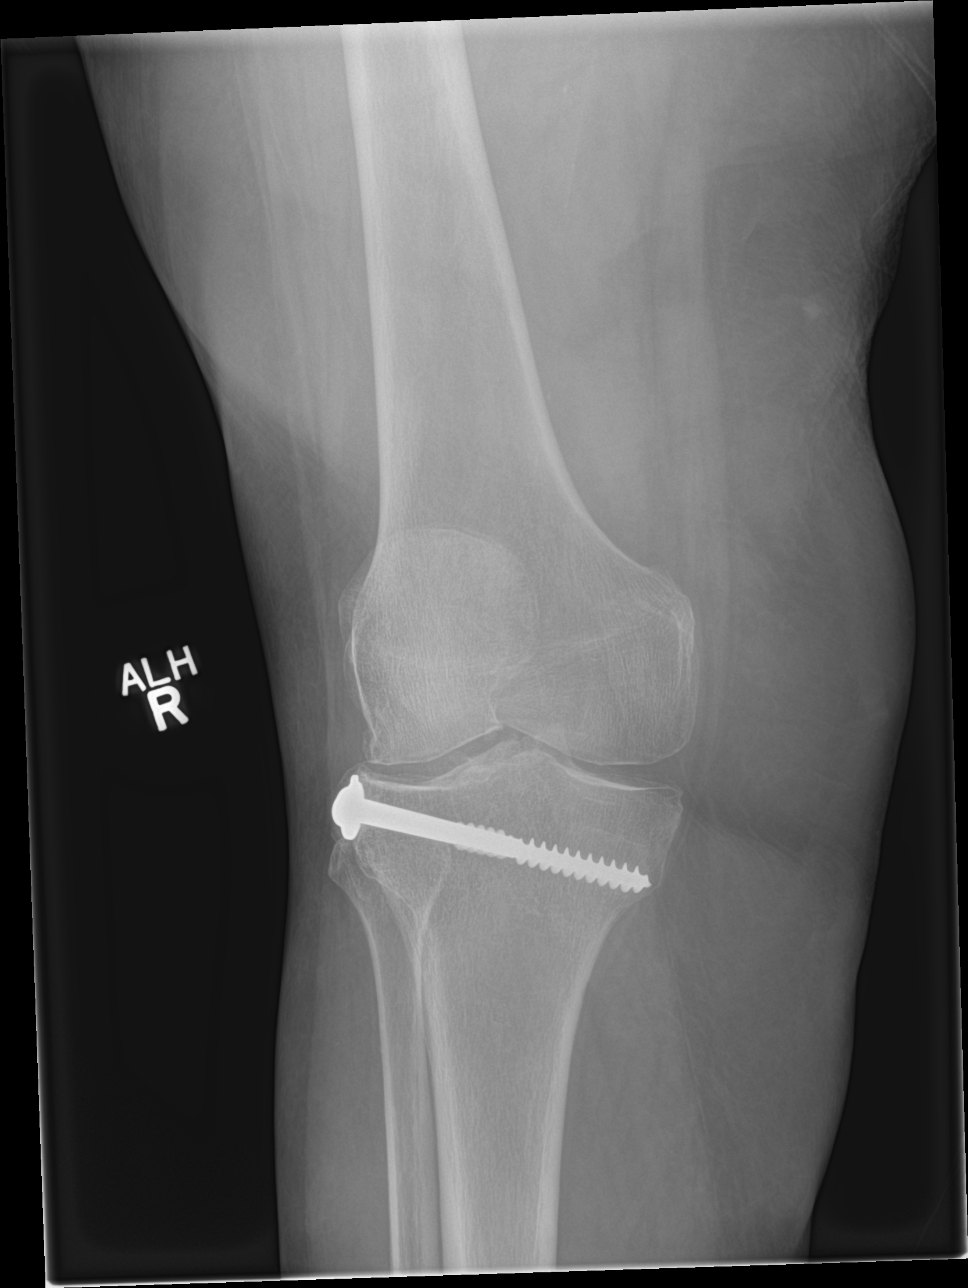

[knee lat]
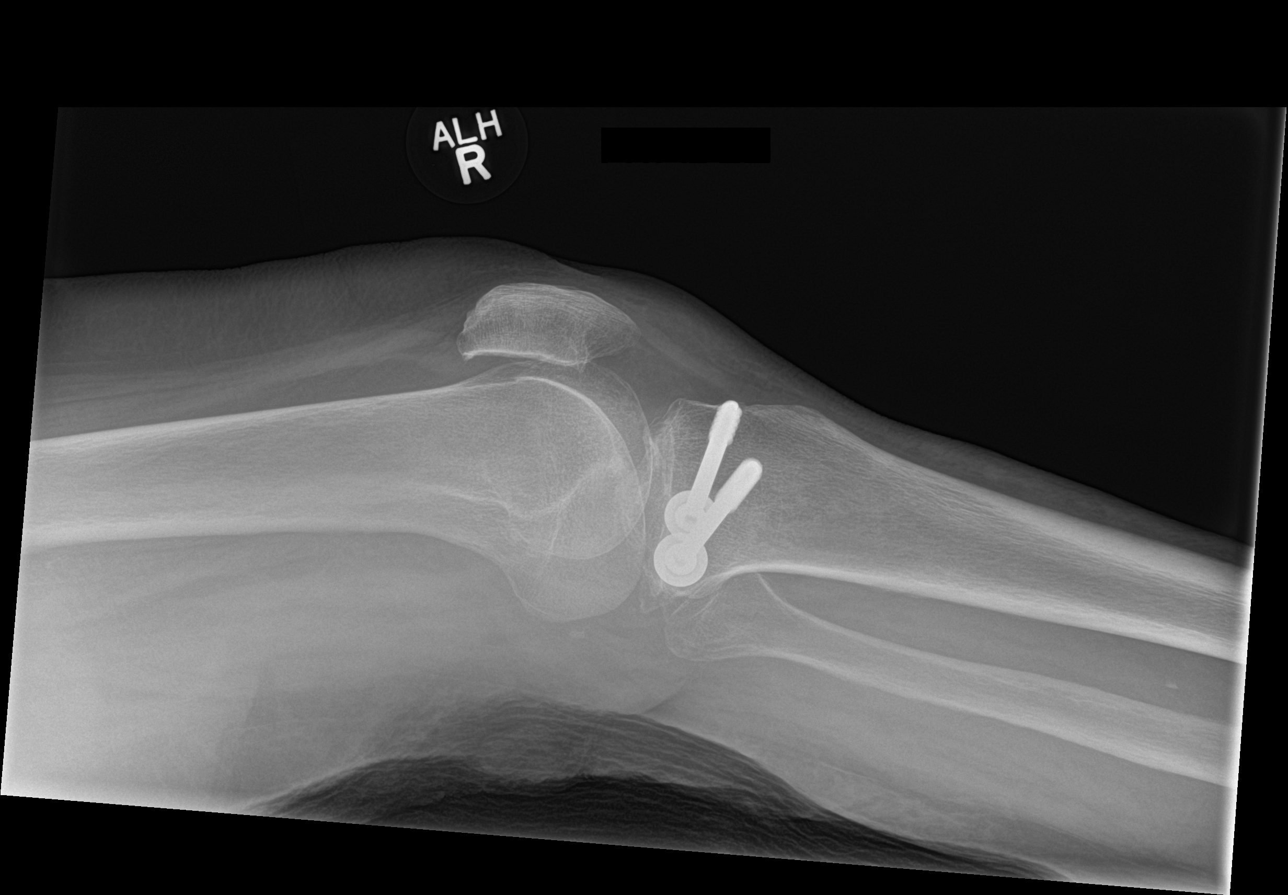

[4 of 4 positions shown; findings below may reference images not displayed]

FINDINGS: Two partially cannulated screws traverse the proximal tibia, no
periprosthetic lucency or fracture. No fracture or dislocation. The
alignment is maintained. There is mild osteoarthritis. No joint
effusion.
IMPRESSION: Mild osteoarthritis.  No fracture or dislocation.
